# Patient Record
Sex: Male | Born: 1945 | Race: White | Hispanic: No | Marital: Married | Smoking: Former smoker
Health system: Southern US, Community
[De-identification: ages and names within clinical notes are randomized; demographics above are authoritative.]

## PROBLEM LIST (undated history)

## (undated) DIAGNOSIS — I252 Old myocardial infarction: Secondary | ICD-10-CM

## (undated) DIAGNOSIS — M1990 Unspecified osteoarthritis, unspecified site: Secondary | ICD-10-CM

## (undated) DIAGNOSIS — I2510 Atherosclerotic heart disease of native coronary artery without angina pectoris: Secondary | ICD-10-CM

## (undated) DIAGNOSIS — Z87442 Personal history of urinary calculi: Secondary | ICD-10-CM

## (undated) DIAGNOSIS — E782 Mixed hyperlipidemia: Secondary | ICD-10-CM

## (undated) DIAGNOSIS — I119 Hypertensive heart disease without heart failure: Secondary | ICD-10-CM

## (undated) HISTORY — PX: OTHER SURGICAL HISTORY: SHX169

## (undated) HISTORY — DX: Personal history of urinary calculi: Z87.442

## (undated) HISTORY — DX: Unspecified osteoarthritis, unspecified site: M19.90

## (undated) HISTORY — PX: CATARACT EXTRACTION, BILATERAL: SHX1313

## (undated) HISTORY — DX: Mixed hyperlipidemia: E78.2

## (undated) HISTORY — DX: Hypertensive heart disease without heart failure: I11.9

## (undated) HISTORY — PX: PTCA: SHX146

## (undated) HISTORY — DX: Old myocardial infarction: I25.2

## (undated) HISTORY — PX: HERNIA REPAIR: SHX51

## (undated) HISTORY — DX: Atherosclerotic heart disease of native coronary artery without angina pectoris: I25.10

---

## 2007-06-12 ENCOUNTER — Inpatient Hospital Stay (HOSPITAL_COMMUNITY): Admission: AD | Admit: 2007-06-12 | Discharge: 2007-06-14 | Payer: Self-pay | Admitting: Neurosurgery

## 2010-08-22 ENCOUNTER — Inpatient Hospital Stay (HOSPITAL_COMMUNITY)
Admission: EM | Admit: 2010-08-22 | Discharge: 2010-08-25 | Payer: Self-pay | Source: Home / Self Care | Attending: Orthopedic Surgery | Admitting: Orthopedic Surgery

## 2010-09-26 NOTE — Discharge Summary (Signed)
  Bryan Zimmerman, SCHNACKENBERG NO.:  [id number]  MEDICAL RECORD NO.:  [id number]          PATIENT TYPE:  INP  LOCATION:  5024                         FACILITY:  [facility]  PHYSICIAN:  Dionne Ano. 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  He was taken to the operative arena where he underwent an extensive microsurgical reconstruction.  We repaired bone, tendon, artery, nerve, and veins.  The patient was seen and treated on his date of admission August 22, 2010.  Subsequent to this, the patient had postoperative course as detailed.  I should note his full H&P was completed August 22, 2010 and is dictated.  His postoperative course was significant for being placed on dextran as well as aspirin.  He matriculated to his postoperative course nicely and on August 25, 2010 was noted to have a clear chest, nontender abdomen, tolerating a regular diet, voiding well, had excellent refit to the tip of his thumb.  No signs or symptoms of DVT, urinary tract infection, or other problems and was stable for discharge with stable cardiovascular features.  He was discharged home on August 25, 2010.  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  DISCHARGE MEDICINES: 1. Percocet 5 mg 1-2 q.4-6 h. p.r.n. pain p.o., dispensed #40. 2. Aspirin 325 mg enteric-coated tablet b.i.d. x2 weeks, then once     daily x6 weeks. 3. Keflex 500 mg t.i.d. x7 days. 4. Peri-Colace b.i.d.  He will return to the office to see [place] in 7-10 days and continue full neurovascular precautions as discussed.  It has been a pleasure to see him and treat him.  We  wish him the best for the future.     Dionne Ano. Amanda Pea, M.D.     Nash Mantis  D:  09/20/2010  T:  09/21/2010  Job:  [id number]  Electronically Signed by Dominica Severin M.D. on 09/26/2010 06:51:49 PM

## 2010-10-30 LAB — DIFFERENTIAL
Basophils Absolute: 0 10*3/uL (ref 0.0–0.1)
Basophils Absolute: 0 10*3/uL (ref 0.0–0.1)
Basophils Relative: 0 % (ref 0–1)
Basophils Relative: 1 % (ref 0–1)
Eosinophils Absolute: 0.2 10*3/uL (ref 0.0–0.7)
Eosinophils Absolute: 0.3 10*3/uL (ref 0.0–0.7)
Eosinophils Relative: 3 % (ref 0–5)
Eosinophils Relative: 4 % (ref 0–5)
Lymphocytes Relative: 12 % (ref 12–46)
Lymphocytes Relative: 14 % (ref 12–46)
Lymphs Abs: 0.9 10*3/uL (ref 0.7–4.0)
Lymphs Abs: 1.1 10*3/uL (ref 0.7–4.0)
Monocytes Absolute: 0.6 10*3/uL (ref 0.1–1.0)
Monocytes Absolute: 0.7 10*3/uL (ref 0.1–1.0)
Monocytes Relative: 9 % (ref 3–12)
Monocytes Relative: 9 % (ref 3–12)
Neutro Abs: 5.3 10*3/uL (ref 1.7–7.7)
Neutro Abs: 5.6 10*3/uL (ref 1.7–7.7)
Neutrophils Relative %: 73 % (ref 43–77)
Neutrophils Relative %: 74 % (ref 43–77)

## 2010-10-30 LAB — CBC
HCT: 39.1 % (ref 39.0–52.0)
HCT: 42.3 % (ref 39.0–52.0)
Hemoglobin: 13.4 g/dL (ref 13.0–17.0)
Hemoglobin: 14.7 g/dL (ref 13.0–17.0)
MCH: 29.8 pg (ref 26.0–34.0)
MCH: 30 pg (ref 26.0–34.0)
MCHC: 34.3 g/dL (ref 30.0–36.0)
MCHC: 34.8 g/dL (ref 30.0–36.0)
MCV: 85.8 fL (ref 78.0–100.0)
MCV: 87.5 fL (ref 78.0–100.0)
Platelets: 143 10*3/uL — ABNORMAL LOW (ref 150–400)
Platelets: 151 10*3/uL (ref 150–400)
RBC: 4.47 MIL/uL (ref 4.22–5.81)
RBC: 4.93 MIL/uL (ref 4.22–5.81)
RDW: 12.7 % (ref 11.5–15.5)
RDW: 12.7 % (ref 11.5–15.5)
WBC: 7.2 10*3/uL (ref 4.0–10.5)
WBC: 7.6 10*3/uL (ref 4.0–10.5)

## 2010-10-30 LAB — COMPREHENSIVE METABOLIC PANEL
ALT: 27 U/L (ref 0–53)
ALT: 28 U/L (ref 0–53)
AST: 22 U/L (ref 0–37)
AST: 26 U/L (ref 0–37)
Albumin: 3.3 g/dL — ABNORMAL LOW (ref 3.5–5.2)
Albumin: 3.7 g/dL (ref 3.5–5.2)
Alkaline Phosphatase: 60 U/L (ref 39–117)
Alkaline Phosphatase: 70 U/L (ref 39–117)
BUN: 13 mg/dL (ref 6–23)
BUN: 16 mg/dL (ref 6–23)
CO2: 25 mEq/L (ref 19–32)
CO2: 27 mEq/L (ref 19–32)
Calcium: 8.4 mg/dL (ref 8.4–10.5)
Calcium: 8.9 mg/dL (ref 8.4–10.5)
Chloride: 105 mEq/L (ref 96–112)
Chloride: 105 mEq/L (ref 96–112)
Creatinine, Ser: 0.89 mg/dL (ref 0.4–1.5)
Creatinine, Ser: 0.95 mg/dL (ref 0.4–1.5)
GFR calc Af Amer: >60 mL/min (ref >60)
GFR calc Af Amer: >60 mL/min (ref >60)
GFR calc non Af Amer: >60 mL/min (ref >60)
GFR calc non Af Amer: >60 mL/min (ref >60)
Glucose, Bld: 116 mg/dL — ABNORMAL HIGH (ref 70–99)
Glucose, Bld: 120 mg/dL — ABNORMAL HIGH (ref 70–99)
Potassium: 4 mEq/L (ref 3.5–5.1)
Potassium: 4.4 mEq/L (ref 3.5–5.1)
Sodium: 138 mEq/L (ref 135–145)
Sodium: 138 mEq/L (ref 135–145)
Total Bilirubin: 0.3 mg/dL (ref 0.3–1.2)
Total Bilirubin: 0.5 mg/dL (ref 0.3–1.2)
Total Protein: 5.9 g/dL — ABNORMAL LOW (ref 6.0–8.3)
Total Protein: 6.3 g/dL (ref 6.0–8.3)

## 2010-10-30 LAB — APTT: aPTT: 29 seconds (ref 24–37)

## 2010-10-30 LAB — PROTIME-INR
INR: 0.96 (ref 0.00–1.49)
Prothrombin Time: 13 seconds (ref 11.6–15.2)

## 2010-11-29 NOTE — H&P (Signed)
NAMEPRIYANSH, PRY NO.:  [id number]  MEDICAL RECORD NO.:  [id number]          PATIENT TYPE:  INP  LOCATION:  5024                         FACILITY:  [facility]  PHYSICIAN:  Dionne Ano. Taron Mondor, M.D.DATE OF BIRTH:  May 04, 1946  DATE OF ADMISSION:  08/22/2010 DATE OF DISCHARGE:                             HISTORY & PHYSICAL   CHIEF COMPLAINT:  "I cut my thumb."  HISTORY OF PRESENT ILLNESS:  Mr. Brow is a very pleasant gentleman who is 65 years of age and is left-hand dominant who presents to the [facility] Emergency Room for evaluation of his right thumb after a skill saw injury that he sustained earlier today approximately 1:15.  He was making a gate for his dog when he sustained a skill saw injury to the thumb sustaining a near complete amputation to the thumb.  He proceeded immediately to the [facility] Emergency Room for evaluation. He describes numbness and tingling along the radial digital nerve aspect of the thumb, but does have some sensation per his description along the radial digital nerve aspect.  His tetanus was updated in the emergency room setting.  He has been n.p.o. since approximately 12:30 today at which point in time he states he ate "a sandwich."  He was given Dilaudid in the emergency room setting, thus his pain is currently under control.  He denies any injury to the remaining digits or hand or wrist region.  PAST MEDICAL HISTORY:  Significant for "kidney stones and degenerative disk disease as well as hard-of-hearing."  CURRENT MEDICATIONS:  He takes Advil intermittently, Osteo Bi-Flex, and multivitamins.  ALLERGIES:  No known drug allergies.  SURGICAL HISTORY:  He has had a prior questionable L4-L5 procedure performed by Dr. Wynetta Emery.  In addition he has had "a hernia repair" unspecified as well as a nephrolithiasis removal.  SOCIAL HISTORY:  He is married.  He denies tobacco, alcohol use or illicit drug use.  FAMILY MEDICAL  HISTORY:  Noncontributory.  PHYSICAL EXAMINATION:  GENERAL:  He is a very pleasant gentleman in no acute distress, appearing his stated age, weight and height.  He is alert and oriented x3, well groomed. VITAL SIGNS:  Blood pressure is 109/83 at the time of admit, respirations 18, temperature is 98.2, O2 sats 98% on room air, heart rate 74. HEENT:  He is hard-of-hearing, otherwise head is atraumatic, normocephalic. NECK:  Supple. CHEST:  Clear to auscultation bilaterally. HEART:  S1, S2. ABDOMEN:  Soft, nontender.  Bowel sounds are positive.  No significant pain or guarding is present. EXTREMITIES:  Evaluation of lower extremity showed his range of motion is intact.  Strength is intact.  He is nontender about the lower extremities.  Evaluation of the upper extremities particularly right upper extremity shows that he has a circumstantial laceration encompassing approximately 60% of the thumb beginning approximately midline volarly at the level of MP joint pain radiating dorsal laterally to the midportion of the dorsal MP joint.  There is minimal [employer] functioning.  He is known to have sluggish refill present about the digit.  He has marked diminished sensation along the radial digital nerve aspect.  He does have  sensation along the ulnar digital nerve aspect per his description and with pinpoint testing.  Remaining digits show that his range of motion intact.  Sensation refill is intact.  Radiographs, two views of the hand are reviewed and shows that he has an open fracture process sustaining just distal to the metaphysis at the MCP joint.  This is apparently extraarticular.  ASSESSMENT: 1. Status post skill saw injury to the right thumb with near complete     amputation and open fracture and obvious nerve artery intended     damage present. 2. History of low back surgeries, history of hernia repair, history of     hard-of-hearing. History of "kidney stones" removed.  PLAN:  We  had a lengthy discussion with Mr. Loftus in regards to his upper extremity predicament and the serious nature of the injury in regards to the viability of his thumb.  We would recommend proceeding to the OR emergently for I and D, ORIF and attempted repair of nerve artery and tendinous structures as necessary.  He understands that risk of surgery can include infection, bleeding, anesthesia, damage to normal structures, loss of digit and potential need for future reconstructive procedures to the thumb.  With this in mind, they decided to proceed. Thus we will obtain preoperative labs as they are currently pending and proceed to the operative room.  All questions were encouraged and answered.     Karie Chimera, P.A.-C.   ______________________________ Dionne Ano. Amanda Pea, M.D.    BB/MEDQ  D:  08/22/2010  T:  08/23/2010  Job:  [id number]  Electronically Signed by Karie Chimera P.A.-C. on 11/22/2010 03:50:33 PM Electronically Signed by Dominica Severin M.D. on 11/29/2010 12:38:39 PM

## 2011-01-02 NOTE — Op Note (Signed)
NAMENOX, TALENT NO.:  [id number]   MEDICAL RECORD NO.:  [id number]          PATIENT TYPE:  INP   LOCATION:  5501                         FACILITY:  [facility]   PHYSICIAN:  Donalee Citrin, M.D.        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   PROCEDURE:  Combined intra and extraforaminal approach to lumbar  microdiskectomy with a lumbar laminectomy and extraforaminal approach  and right lumbar microdiskectomy.   SURGEON:  Donalee Citrin, M.D.   ASSISTANT:  Reinaldo Meeker, M.D.   ANESTHESIA:  General endotracheal.   HISTORY OF PRESENT ILLNESS:  The patient is a pleasant 65 year old  gentleman who presented to the office in acute radicular pain with  inability to ambulate secondary to pain control.  The patient had pain  consistent with L4 nerve root pattern.  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  The patient had already failed  trials of steroids and aggressive narcotic use and was admitted from the  office and taken to the operating room within 24 hours for evacuation,  diskectomy, and decompression.  The risks and benefits of the operation  __________ The patient was brought to the OR and induced under general  anesthesia.  The patient was placed prone on the Wilson frame.  The back  was prepped __________ localize the appropriate level so a midline  incision will be made and Bovie electrocautery was used __________  subperiosteal dissection carried out at the lamina of L3-4 and the top  of L5.  The TP at L4 was exposed.  Intraoperative x-ray confirmed this  to be the L4 TP at the  level of the L4 pedicle, and then first doing the  extraforaminal approach to the lateral aspect of facet complex at L4-5  and lateral aspect of the pars at L4 was drilled down and then using a 4  Penfield and a 1 Penfield, the intertransverse ligament was dissected  off of the undersurface of the lateral margin of the facet complex and  pars, and this was removed in piecemeal fashion with a __________  Kerrison punch.  At this point, the intertransverse ligament was  identified.  It was peeled away and removed in piecemeal fashion.  The  L4 nerve root was identified, at least it was presumed to be the L4  nerve root was identified, and it was noted to be markedly flattened  against a very large disk herniation still contained with the ligament  at this level.  It was very difficult to determine the beginnings of the  root and the beginning of the disk rupture, so at this time, attention  was taken to exposure from within the canal to further identify the  anatomy  so the inferior aspect of L4, medial facet complex, and superior  aspect of L5 was removed.  The ligamentum flavum was removed in  piecemeal fashion which was noted to be markedly hypertrophic and  displacing the thecal sac medially.  This was all teased away with a 4  Penfield and removed in piecemeal fashion with a __________  Kerrison  punch.  At this point, the operative microscope was draped and brought  onto the field.  Under microscopic illumination, the remainder of the  lateral ligament was removed to identify the lateral margin of the  thecal sac and disk space.  The proximal L5 nerve root was identified  and unroofed, and this enabled [place] access to the level of the disk space.  Then working back in the extraforaminal space under microscopic  illumination, the remainder of the intertransverse ligament was removed  and further medial exposure of the proximal takeoff of the L4 nerve root  was obtained from the  extraforaminal space.  This allowed further  identification of the L4 nerve root.  A 4 Penfield was used to dissect  the L4 nerve root over the top of the aspect of the ruptured disk.  At  this point, a small puncture hole was made in the ligament containing  the disk, and several large fragments of disk were immediately  expressed.  After this was confirmed to be appropriate identification of  the ruptured disk, the annulotomy was extended, and using a blunt nerve  hook, several more very large fragments of disk were removed from  medially under the undersurface of the 4 root.  Then the L4 nerve root  was still markedly tethered and displaced superiorly as well as some  displacement of the proximal aspect of the root in the extraforaminal  space.  At this point, it was decided to enter the disk space from  within the canal to further identify the foraminal component of the disk  rupture.  The __________  was used to reflect the L5 nerve root  medially.  Annulotomy was made, and disk space was cleaned out.  Several  large fragments were removed from the superior aspect of the disk space  from within the canal that was not appreciated on the MRI scan.  This  also tracked out into the extraforaminal space, so working from both  inside and outside, a large component of disk material was removed from  underneath the facet complex with a combination of a downgoing Epstein  curette and upbiting pituitaries.  Going back and forth in this fashion  allowed further identification and decompressing of the 4 root. Some  additional disk was removed from both proximally and distally at the 4  root at the extraforaminal space, and at the end of the diskectomy,  there was no further stenosis on the 4 root or the 5 root.  It was  explored with a hockey stick and coronary dilator.  The 4 root was flush  against the pedicle and easily mobilizable.  The wound was then  copiously irrigated, and meticulous  hemostasis was maintained.  Gelfoam  was overlaid on top of the dura in both compartments, and the muscle and  fascia were reapproximated in layers with interrupted 3-0 Vicryl and  subcuticular Dermabond.  Benzoin and Steri-Strips were then applied, and  the patient went to the recovery room in stable condition.  At the end  of the case, all counts were correct.  ______________________________  Donalee Citrin, M.D.     GC/MEDQ  D:  06/13/2007  T:  06/15/2007  Job:  [id number]

## 2011-01-05 NOTE — H&P (Signed)
Bryan, Zimmerman NO.:  [id number]   MEDICAL RECORD NO.:  [id number]          PATIENT TYPE:  INP   LOCATION:  5501                         FACILITY:  [facility]   PHYSICIAN:  Donalee Citrin, M.D.        DATE OF BIRTH:  1946-03-14   DATE OF ADMISSION:  06/12/2007  DATE OF DISCHARGE:  06/14/2007                              HISTORY & PHYSICAL   ADMITTING DIAGNOSIS:  Lumbar four radiculopathy from large  extraforaminal disc herniation, lumbar four-five, on the right,  compressing the right lumbar four nerve root.   HISTORY OF PRESENT ILLNESS:  The patient is a 65 year old gentleman who  presented to the office today in excruciating right buttock and leg  pain, nonambulatory, unable to tolerate it.  This has been going on for  several days, and progressively worse over the last week.  He denied any  left leg symptoms or bowel or bladder complaints.   PAST MEDICAL HISTORY:  Noncontributory.   PAST SURGICAL HISTORY:  Noncontributory.   PHYSICAL EXAMINATION:  GENERAL:  The patient was awake, alert, oriented  x4.  Cranial nerves were intact.  Pupils were equal __________ .  EXTREMITIES:  Strength in the upper extremities is 5/5, lower extremity  strength was also 5/5.  He did have some decreased sensation in the L4  distribution of the right leg and a positive straight leg raise.  CARDIOPULMONARY:  Exam was within normal limits.   MRI scan showed a very large extraforaminal disc herniation on the right  L4, and the patient was in intractable pain to all our narcotic and  steroid pain control.  The patient will be admitted for surgery tomorrow  to decompress the right L4 nerve root.           ______________________________  Donalee Citrin, M.D.     GC/MEDQ  D:  07/31/2007  T:  07/31/2007  Job:  [id number]

## 2011-05-30 LAB — BASIC METABOLIC PANEL
BUN: 22
CO2: 28
Calcium: 9.6
Chloride: 101
Creatinine, Ser: 0.81
GFR calc Af Amer: >60
GFR calc non Af Amer: >60
Glucose, Bld: 89
Potassium: 4.1
Sodium: 139

## 2011-05-30 LAB — DIFFERENTIAL
Basophils Absolute: 0.1
Basophils Relative: 1
Eosinophils Absolute: 0.1
Eosinophils Relative: 1
Lymphocytes Relative: 17
Lymphs Abs: 1.3
Monocytes Absolute: 0.4
Monocytes Relative: 5
Neutro Abs: 5.9
Neutrophils Relative %: 76

## 2011-05-30 LAB — CBC
HCT: 46.9
Hemoglobin: 16
MCHC: 34
MCV: 86.7
Platelets: UNDETERMINED
RBC: 5.41
RDW: 12.5
WBC: 7.8

## 2011-11-05 DIAGNOSIS — E785 Hyperlipidemia, unspecified: Secondary | ICD-10-CM | POA: Diagnosis not present

## 2011-11-05 DIAGNOSIS — I2510 Atherosclerotic heart disease of native coronary artery without angina pectoris: Secondary | ICD-10-CM | POA: Diagnosis not present

## 2011-11-05 DIAGNOSIS — I10 Essential (primary) hypertension: Secondary | ICD-10-CM | POA: Diagnosis not present

## 2011-12-19 DIAGNOSIS — S61409A Unspecified open wound of unspecified hand, initial encounter: Secondary | ICD-10-CM | POA: Diagnosis not present

## 2011-12-19 DIAGNOSIS — S6990XA Unspecified injury of unspecified wrist, hand and finger(s), initial encounter: Secondary | ICD-10-CM | POA: Diagnosis not present

## 2011-12-19 DIAGNOSIS — T1490XA Injury, unspecified, initial encounter: Secondary | ICD-10-CM | POA: Diagnosis not present

## 2012-01-11 DIAGNOSIS — E785 Hyperlipidemia, unspecified: Secondary | ICD-10-CM | POA: Diagnosis not present

## 2012-01-11 DIAGNOSIS — I2510 Atherosclerotic heart disease of native coronary artery without angina pectoris: Secondary | ICD-10-CM | POA: Diagnosis not present

## 2012-01-11 DIAGNOSIS — Z79899 Other long term (current) drug therapy: Secondary | ICD-10-CM | POA: Diagnosis not present

## 2012-02-07 DIAGNOSIS — E785 Hyperlipidemia, unspecified: Secondary | ICD-10-CM | POA: Diagnosis not present

## 2012-02-07 DIAGNOSIS — I10 Essential (primary) hypertension: Secondary | ICD-10-CM | POA: Diagnosis not present

## 2012-02-07 DIAGNOSIS — I2510 Atherosclerotic heart disease of native coronary artery without angina pectoris: Secondary | ICD-10-CM | POA: Diagnosis not present

## 2012-02-07 DIAGNOSIS — Z6828 Body mass index (BMI) 28.0-28.9, adult: Secondary | ICD-10-CM | POA: Diagnosis not present

## 2012-02-17 IMAGING — CR DG CHEST 2V
1 series · 1 of 1 positions shown · non-contrast
Comparison: None.

CLINICAL DATA: Chest pain.

CHEST - 2 VIEW

[view not recorded]
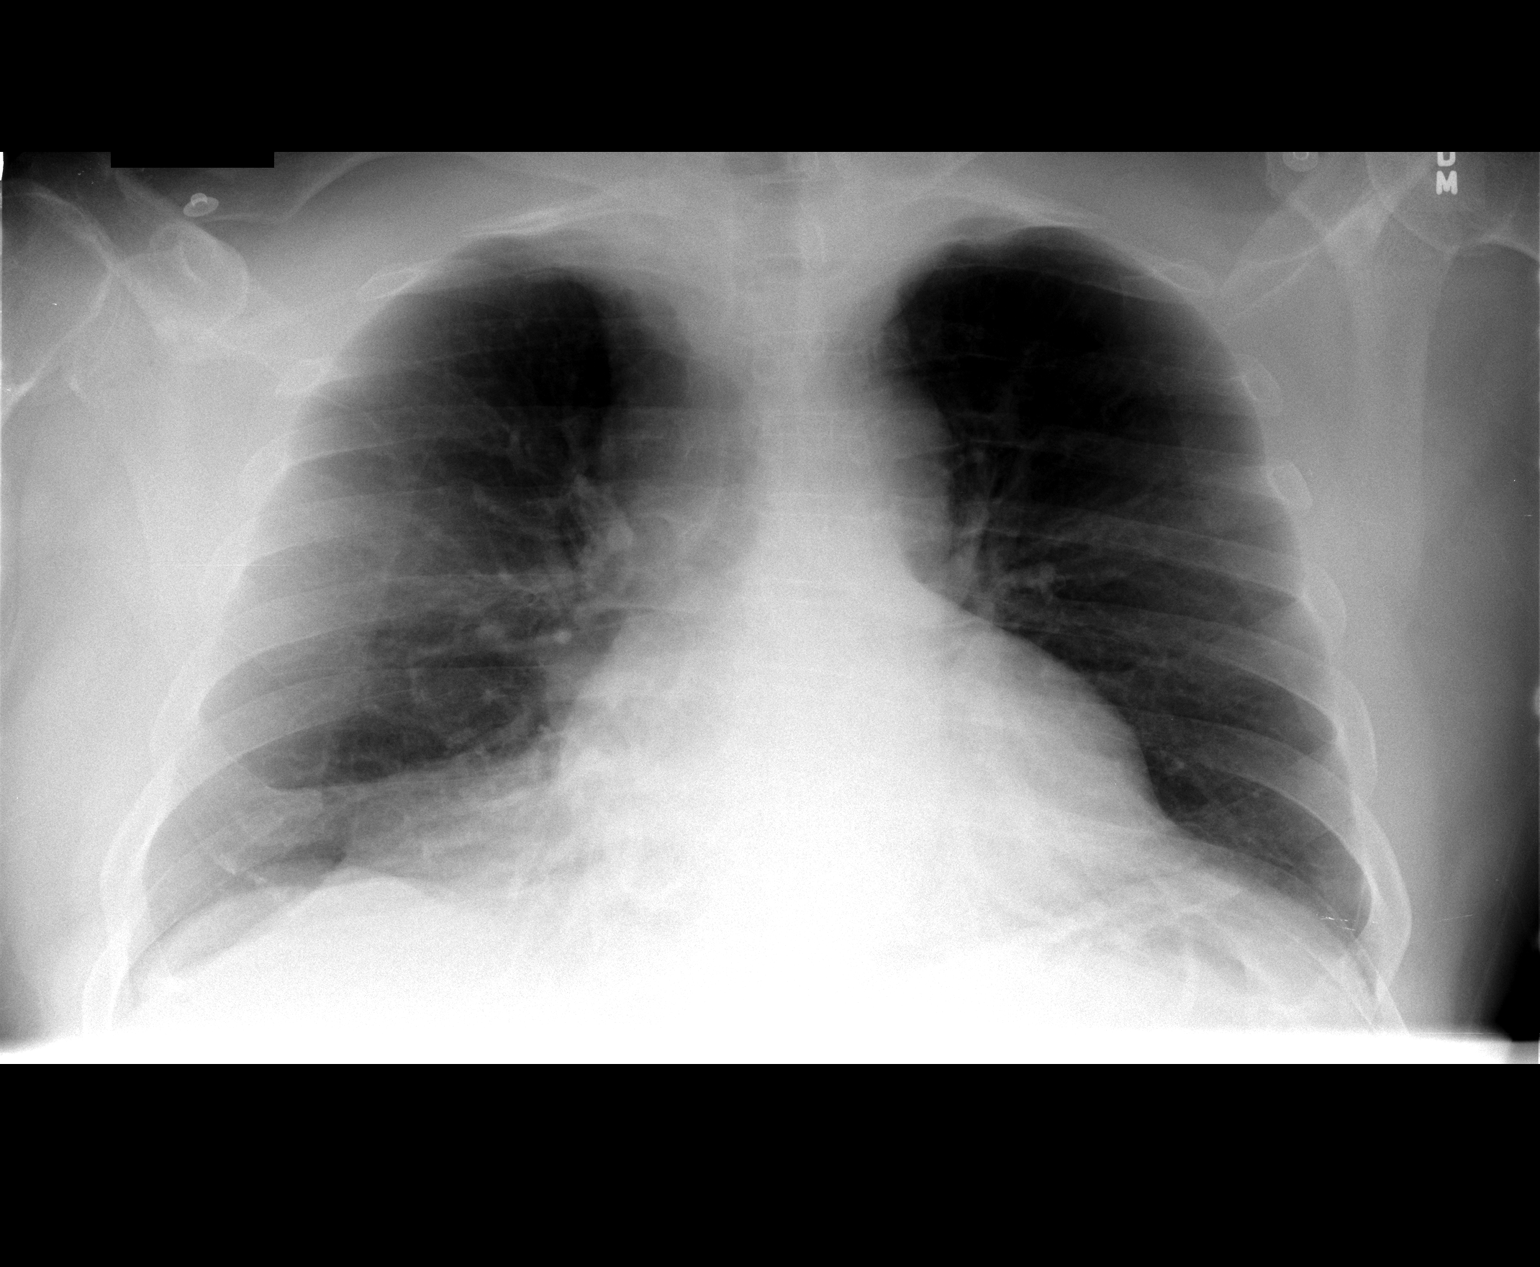

[1 of 1 positions shown; findings below may reference images not displayed]

FINDINGS: Lateral view degraded by patient arm position.

Frontal view degraded by AP portable technique.  Apical lordotic
patient positioning. Midline trachea.  Normal heart size for level
of inspiration.  No pleural effusion or pneumothorax.  Clear lungs.
IMPRESSION: 1. No acute cardiopulmonary disease.
2.  Mildly degraded by apical lordotic AP frontal view.

## 2012-05-13 DIAGNOSIS — I2510 Atherosclerotic heart disease of native coronary artery without angina pectoris: Secondary | ICD-10-CM | POA: Diagnosis not present

## 2012-05-13 DIAGNOSIS — Z125 Encounter for screening for malignant neoplasm of prostate: Secondary | ICD-10-CM | POA: Diagnosis not present

## 2012-05-13 DIAGNOSIS — E785 Hyperlipidemia, unspecified: Secondary | ICD-10-CM | POA: Diagnosis not present

## 2012-06-19 DIAGNOSIS — Z1211 Encounter for screening for malignant neoplasm of colon: Secondary | ICD-10-CM | POA: Diagnosis not present

## 2012-08-26 DIAGNOSIS — E785 Hyperlipidemia, unspecified: Secondary | ICD-10-CM | POA: Diagnosis not present

## 2012-08-26 DIAGNOSIS — I2510 Atherosclerotic heart disease of native coronary artery without angina pectoris: Secondary | ICD-10-CM | POA: Diagnosis not present

## 2012-08-26 DIAGNOSIS — I10 Essential (primary) hypertension: Secondary | ICD-10-CM | POA: Diagnosis not present

## 2012-08-26 DIAGNOSIS — Z23 Encounter for immunization: Secondary | ICD-10-CM | POA: Diagnosis not present

## 2012-08-26 DIAGNOSIS — Z6828 Body mass index (BMI) 28.0-28.9, adult: Secondary | ICD-10-CM | POA: Diagnosis not present

## 2012-11-10 DIAGNOSIS — Z1211 Encounter for screening for malignant neoplasm of colon: Secondary | ICD-10-CM | POA: Diagnosis not present

## 2012-11-11 DIAGNOSIS — Z1211 Encounter for screening for malignant neoplasm of colon: Secondary | ICD-10-CM | POA: Diagnosis not present

## 2012-11-27 DIAGNOSIS — I10 Essential (primary) hypertension: Secondary | ICD-10-CM | POA: Diagnosis not present

## 2012-11-27 DIAGNOSIS — E785 Hyperlipidemia, unspecified: Secondary | ICD-10-CM | POA: Diagnosis not present

## 2012-11-27 DIAGNOSIS — I2510 Atherosclerotic heart disease of native coronary artery without angina pectoris: Secondary | ICD-10-CM | POA: Diagnosis not present

## 2013-03-09 DIAGNOSIS — M25519 Pain in unspecified shoulder: Secondary | ICD-10-CM | POA: Diagnosis not present

## 2013-03-09 DIAGNOSIS — I2510 Atherosclerotic heart disease of native coronary artery without angina pectoris: Secondary | ICD-10-CM | POA: Diagnosis not present

## 2013-03-09 DIAGNOSIS — Z6828 Body mass index (BMI) 28.0-28.9, adult: Secondary | ICD-10-CM | POA: Diagnosis not present

## 2013-03-09 DIAGNOSIS — E785 Hyperlipidemia, unspecified: Secondary | ICD-10-CM | POA: Diagnosis not present

## 2013-06-09 DIAGNOSIS — E785 Hyperlipidemia, unspecified: Secondary | ICD-10-CM | POA: Diagnosis not present

## 2013-06-09 DIAGNOSIS — I2510 Atherosclerotic heart disease of native coronary artery without angina pectoris: Secondary | ICD-10-CM | POA: Diagnosis not present

## 2013-06-09 DIAGNOSIS — I10 Essential (primary) hypertension: Secondary | ICD-10-CM | POA: Diagnosis not present

## 2013-06-09 DIAGNOSIS — Z23 Encounter for immunization: Secondary | ICD-10-CM | POA: Diagnosis not present

## 2013-06-09 DIAGNOSIS — Z125 Encounter for screening for malignant neoplasm of prostate: Secondary | ICD-10-CM | POA: Diagnosis not present

## 2013-06-09 DIAGNOSIS — R29898 Other symptoms and signs involving the musculoskeletal system: Secondary | ICD-10-CM | POA: Diagnosis not present

## 2013-07-07 DIAGNOSIS — Z8619 Personal history of other infectious and parasitic diseases: Secondary | ICD-10-CM | POA: Diagnosis not present

## 2013-09-11 DIAGNOSIS — I10 Essential (primary) hypertension: Secondary | ICD-10-CM | POA: Diagnosis not present

## 2013-09-11 DIAGNOSIS — I2510 Atherosclerotic heart disease of native coronary artery without angina pectoris: Secondary | ICD-10-CM | POA: Diagnosis not present

## 2013-09-11 DIAGNOSIS — E785 Hyperlipidemia, unspecified: Secondary | ICD-10-CM | POA: Diagnosis not present

## 2013-12-23 DIAGNOSIS — M25569 Pain in unspecified knee: Secondary | ICD-10-CM | POA: Diagnosis not present

## 2013-12-23 DIAGNOSIS — M898X9 Other specified disorders of bone, unspecified site: Secondary | ICD-10-CM | POA: Diagnosis not present

## 2013-12-23 DIAGNOSIS — E7800 Pure hypercholesterolemia, unspecified: Secondary | ICD-10-CM | POA: Diagnosis not present

## 2013-12-23 DIAGNOSIS — IMO0002 Reserved for concepts with insufficient information to code with codable children: Secondary | ICD-10-CM | POA: Diagnosis not present

## 2013-12-23 DIAGNOSIS — I10 Essential (primary) hypertension: Secondary | ICD-10-CM | POA: Diagnosis not present

## 2013-12-23 DIAGNOSIS — M1710 Unilateral primary osteoarthritis, unspecified knee: Secondary | ICD-10-CM | POA: Diagnosis not present

## 2013-12-23 DIAGNOSIS — M25869 Other specified joint disorders, unspecified knee: Secondary | ICD-10-CM | POA: Diagnosis not present

## 2013-12-23 DIAGNOSIS — I2510 Atherosclerotic heart disease of native coronary artery without angina pectoris: Secondary | ICD-10-CM | POA: Diagnosis not present

## 2013-12-24 DIAGNOSIS — E7800 Pure hypercholesterolemia, unspecified: Secondary | ICD-10-CM | POA: Diagnosis not present

## 2013-12-24 DIAGNOSIS — I10 Essential (primary) hypertension: Secondary | ICD-10-CM | POA: Diagnosis not present

## 2013-12-29 DIAGNOSIS — D696 Thrombocytopenia, unspecified: Secondary | ICD-10-CM | POA: Diagnosis not present

## 2014-01-20 DIAGNOSIS — E7800 Pure hypercholesterolemia, unspecified: Secondary | ICD-10-CM | POA: Diagnosis not present

## 2014-01-20 DIAGNOSIS — IMO0001 Reserved for inherently not codable concepts without codable children: Secondary | ICD-10-CM | POA: Diagnosis not present

## 2014-01-20 DIAGNOSIS — M1710 Unilateral primary osteoarthritis, unspecified knee: Secondary | ICD-10-CM | POA: Diagnosis not present

## 2014-02-16 DIAGNOSIS — Z79899 Other long term (current) drug therapy: Secondary | ICD-10-CM | POA: Diagnosis not present

## 2014-02-16 DIAGNOSIS — I2510 Atherosclerotic heart disease of native coronary artery without angina pectoris: Secondary | ICD-10-CM | POA: Diagnosis not present

## 2014-02-16 DIAGNOSIS — E785 Hyperlipidemia, unspecified: Secondary | ICD-10-CM | POA: Diagnosis not present

## 2014-02-23 DIAGNOSIS — I2510 Atherosclerotic heart disease of native coronary artery without angina pectoris: Secondary | ICD-10-CM | POA: Diagnosis not present

## 2014-04-20 DIAGNOSIS — Z23 Encounter for immunization: Secondary | ICD-10-CM | POA: Diagnosis not present

## 2014-05-03 DIAGNOSIS — M1710 Unilateral primary osteoarthritis, unspecified knee: Secondary | ICD-10-CM | POA: Diagnosis not present

## 2014-05-03 DIAGNOSIS — I10 Essential (primary) hypertension: Secondary | ICD-10-CM | POA: Diagnosis not present

## 2014-05-03 DIAGNOSIS — E7800 Pure hypercholesterolemia, unspecified: Secondary | ICD-10-CM | POA: Diagnosis not present

## 2014-05-03 DIAGNOSIS — I2510 Atherosclerotic heart disease of native coronary artery without angina pectoris: Secondary | ICD-10-CM | POA: Diagnosis not present

## 2014-05-18 DIAGNOSIS — I2510 Atherosclerotic heart disease of native coronary artery without angina pectoris: Secondary | ICD-10-CM | POA: Diagnosis not present

## 2014-05-18 DIAGNOSIS — Z79899 Other long term (current) drug therapy: Secondary | ICD-10-CM | POA: Diagnosis not present

## 2014-05-18 DIAGNOSIS — E785 Hyperlipidemia, unspecified: Secondary | ICD-10-CM | POA: Diagnosis not present

## 2014-06-24 DIAGNOSIS — Z125 Encounter for screening for malignant neoplasm of prostate: Secondary | ICD-10-CM | POA: Diagnosis not present

## 2014-06-24 DIAGNOSIS — Z1211 Encounter for screening for malignant neoplasm of colon: Secondary | ICD-10-CM | POA: Diagnosis not present

## 2014-06-24 DIAGNOSIS — Z0000 Encounter for general adult medical examination without abnormal findings: Secondary | ICD-10-CM | POA: Diagnosis not present

## 2014-06-25 DIAGNOSIS — Z23 Encounter for immunization: Secondary | ICD-10-CM | POA: Diagnosis not present

## 2014-07-05 DIAGNOSIS — Z1211 Encounter for screening for malignant neoplasm of colon: Secondary | ICD-10-CM | POA: Diagnosis not present

## 2014-08-16 DIAGNOSIS — E780 Pure hypercholesterolemia: Secondary | ICD-10-CM | POA: Diagnosis not present

## 2014-08-16 DIAGNOSIS — I10 Essential (primary) hypertension: Secondary | ICD-10-CM | POA: Diagnosis not present

## 2014-08-19 DIAGNOSIS — M170 Bilateral primary osteoarthritis of knee: Secondary | ICD-10-CM | POA: Diagnosis not present

## 2014-08-19 DIAGNOSIS — I25111 Atherosclerotic heart disease of native coronary artery with angina pectoris with documented spasm: Secondary | ICD-10-CM | POA: Diagnosis not present

## 2014-08-19 DIAGNOSIS — I10 Essential (primary) hypertension: Secondary | ICD-10-CM | POA: Diagnosis not present

## 2014-08-19 DIAGNOSIS — E780 Pure hypercholesterolemia: Secondary | ICD-10-CM | POA: Diagnosis not present

## 2014-09-07 DIAGNOSIS — H18413 Arcus senilis, bilateral: Secondary | ICD-10-CM | POA: Diagnosis not present

## 2014-09-07 DIAGNOSIS — H25813 Combined forms of age-related cataract, bilateral: Secondary | ICD-10-CM | POA: Diagnosis not present

## 2014-11-30 DIAGNOSIS — I10 Essential (primary) hypertension: Secondary | ICD-10-CM | POA: Diagnosis not present

## 2014-11-30 DIAGNOSIS — E780 Pure hypercholesterolemia: Secondary | ICD-10-CM | POA: Diagnosis not present

## 2014-12-03 DIAGNOSIS — I25111 Atherosclerotic heart disease of native coronary artery with angina pectoris with documented spasm: Secondary | ICD-10-CM | POA: Diagnosis not present

## 2014-12-03 DIAGNOSIS — I10 Essential (primary) hypertension: Secondary | ICD-10-CM | POA: Diagnosis not present

## 2014-12-03 DIAGNOSIS — E780 Pure hypercholesterolemia: Secondary | ICD-10-CM | POA: Diagnosis not present

## 2014-12-27 DIAGNOSIS — I10 Essential (primary) hypertension: Secondary | ICD-10-CM | POA: Diagnosis not present

## 2015-03-01 DIAGNOSIS — E780 Pure hypercholesterolemia: Secondary | ICD-10-CM | POA: Diagnosis not present

## 2015-03-01 DIAGNOSIS — I10 Essential (primary) hypertension: Secondary | ICD-10-CM | POA: Diagnosis not present

## 2015-03-04 DIAGNOSIS — I10 Essential (primary) hypertension: Secondary | ICD-10-CM | POA: Diagnosis not present

## 2015-03-04 DIAGNOSIS — I25111 Atherosclerotic heart disease of native coronary artery with angina pectoris with documented spasm: Secondary | ICD-10-CM | POA: Diagnosis not present

## 2015-03-04 DIAGNOSIS — E780 Pure hypercholesterolemia: Secondary | ICD-10-CM | POA: Diagnosis not present

## 2015-06-27 DIAGNOSIS — Z125 Encounter for screening for malignant neoplasm of prostate: Secondary | ICD-10-CM | POA: Diagnosis not present

## 2015-06-27 DIAGNOSIS — Z1211 Encounter for screening for malignant neoplasm of colon: Secondary | ICD-10-CM | POA: Diagnosis not present

## 2015-06-27 DIAGNOSIS — I119 Hypertensive heart disease without heart failure: Secondary | ICD-10-CM | POA: Diagnosis not present

## 2015-06-27 DIAGNOSIS — Z0000 Encounter for general adult medical examination without abnormal findings: Secondary | ICD-10-CM | POA: Diagnosis not present

## 2015-06-27 DIAGNOSIS — Z23 Encounter for immunization: Secondary | ICD-10-CM | POA: Diagnosis not present

## 2015-06-27 DIAGNOSIS — E782 Mixed hyperlipidemia: Secondary | ICD-10-CM | POA: Diagnosis not present

## 2015-09-30 DIAGNOSIS — I10 Essential (primary) hypertension: Secondary | ICD-10-CM | POA: Diagnosis not present

## 2015-09-30 DIAGNOSIS — E782 Mixed hyperlipidemia: Secondary | ICD-10-CM | POA: Diagnosis not present

## 2015-10-04 DIAGNOSIS — I25111 Atherosclerotic heart disease of native coronary artery with angina pectoris with documented spasm: Secondary | ICD-10-CM | POA: Diagnosis not present

## 2015-10-04 DIAGNOSIS — E782 Mixed hyperlipidemia: Secondary | ICD-10-CM | POA: Diagnosis not present

## 2015-10-04 DIAGNOSIS — I119 Hypertensive heart disease without heart failure: Secondary | ICD-10-CM | POA: Diagnosis not present

## 2016-01-24 DIAGNOSIS — I119 Hypertensive heart disease without heart failure: Secondary | ICD-10-CM | POA: Diagnosis not present

## 2016-01-24 DIAGNOSIS — E782 Mixed hyperlipidemia: Secondary | ICD-10-CM | POA: Diagnosis not present

## 2016-01-24 DIAGNOSIS — I25111 Atherosclerotic heart disease of native coronary artery with angina pectoris with documented spasm: Secondary | ICD-10-CM | POA: Diagnosis not present

## 2016-01-26 DIAGNOSIS — I25111 Atherosclerotic heart disease of native coronary artery with angina pectoris with documented spasm: Secondary | ICD-10-CM | POA: Diagnosis not present

## 2016-01-26 DIAGNOSIS — E782 Mixed hyperlipidemia: Secondary | ICD-10-CM | POA: Diagnosis not present

## 2016-01-26 DIAGNOSIS — I119 Hypertensive heart disease without heart failure: Secondary | ICD-10-CM | POA: Diagnosis not present

## 2016-02-06 DIAGNOSIS — H25813 Combined forms of age-related cataract, bilateral: Secondary | ICD-10-CM | POA: Diagnosis not present

## 2016-05-01 DIAGNOSIS — I10 Essential (primary) hypertension: Secondary | ICD-10-CM | POA: Diagnosis not present

## 2016-05-01 DIAGNOSIS — E782 Mixed hyperlipidemia: Secondary | ICD-10-CM | POA: Diagnosis not present

## 2016-05-03 DIAGNOSIS — Z23 Encounter for immunization: Secondary | ICD-10-CM | POA: Diagnosis not present

## 2016-05-03 DIAGNOSIS — E782 Mixed hyperlipidemia: Secondary | ICD-10-CM | POA: Diagnosis not present

## 2016-05-03 DIAGNOSIS — I119 Hypertensive heart disease without heart failure: Secondary | ICD-10-CM | POA: Diagnosis not present

## 2016-05-03 DIAGNOSIS — I25111 Atherosclerotic heart disease of native coronary artery with angina pectoris with documented spasm: Secondary | ICD-10-CM | POA: Diagnosis not present

## 2016-05-08 DIAGNOSIS — H25811 Combined forms of age-related cataract, right eye: Secondary | ICD-10-CM | POA: Diagnosis not present

## 2016-05-08 DIAGNOSIS — H268 Other specified cataract: Secondary | ICD-10-CM | POA: Diagnosis not present

## 2016-05-08 DIAGNOSIS — Z01818 Encounter for other preprocedural examination: Secondary | ICD-10-CM | POA: Diagnosis not present

## 2016-05-09 DIAGNOSIS — H25811 Combined forms of age-related cataract, right eye: Secondary | ICD-10-CM | POA: Diagnosis not present

## 2016-06-20 DIAGNOSIS — H25812 Combined forms of age-related cataract, left eye: Secondary | ICD-10-CM | POA: Diagnosis not present

## 2016-06-20 DIAGNOSIS — H268 Other specified cataract: Secondary | ICD-10-CM | POA: Diagnosis not present

## 2016-08-02 DIAGNOSIS — E782 Mixed hyperlipidemia: Secondary | ICD-10-CM | POA: Diagnosis not present

## 2016-08-02 DIAGNOSIS — I10 Essential (primary) hypertension: Secondary | ICD-10-CM | POA: Diagnosis not present

## 2016-08-06 DIAGNOSIS — I119 Hypertensive heart disease without heart failure: Secondary | ICD-10-CM | POA: Diagnosis not present

## 2016-08-06 DIAGNOSIS — Z125 Encounter for screening for malignant neoplasm of prostate: Secondary | ICD-10-CM | POA: Diagnosis not present

## 2016-08-06 DIAGNOSIS — I25111 Atherosclerotic heart disease of native coronary artery with angina pectoris with documented spasm: Secondary | ICD-10-CM | POA: Diagnosis not present

## 2016-08-06 DIAGNOSIS — Z1389 Encounter for screening for other disorder: Secondary | ICD-10-CM | POA: Diagnosis not present

## 2016-08-06 DIAGNOSIS — E782 Mixed hyperlipidemia: Secondary | ICD-10-CM | POA: Diagnosis not present

## 2016-08-15 DIAGNOSIS — Z1211 Encounter for screening for malignant neoplasm of colon: Secondary | ICD-10-CM | POA: Diagnosis not present

## 2016-08-15 DIAGNOSIS — Z6830 Body mass index (BMI) 30.0-30.9, adult: Secondary | ICD-10-CM | POA: Diagnosis not present

## 2016-08-15 DIAGNOSIS — Z0000 Encounter for general adult medical examination without abnormal findings: Secondary | ICD-10-CM | POA: Diagnosis not present

## 2016-08-15 DIAGNOSIS — Z125 Encounter for screening for malignant neoplasm of prostate: Secondary | ICD-10-CM | POA: Diagnosis not present

## 2016-11-05 DIAGNOSIS — E782 Mixed hyperlipidemia: Secondary | ICD-10-CM | POA: Diagnosis not present

## 2016-11-05 DIAGNOSIS — I10 Essential (primary) hypertension: Secondary | ICD-10-CM | POA: Diagnosis not present

## 2016-11-07 DIAGNOSIS — I2510 Atherosclerotic heart disease of native coronary artery without angina pectoris: Secondary | ICD-10-CM | POA: Diagnosis not present

## 2016-11-07 DIAGNOSIS — I119 Hypertensive heart disease without heart failure: Secondary | ICD-10-CM | POA: Diagnosis not present

## 2016-11-07 DIAGNOSIS — E782 Mixed hyperlipidemia: Secondary | ICD-10-CM | POA: Diagnosis not present

## 2017-03-15 ENCOUNTER — Other Ambulatory Visit: Payer: Self-pay

## 2017-03-22 DIAGNOSIS — E782 Mixed hyperlipidemia: Secondary | ICD-10-CM | POA: Diagnosis not present

## 2017-03-22 DIAGNOSIS — I119 Hypertensive heart disease without heart failure: Secondary | ICD-10-CM | POA: Diagnosis not present

## 2017-03-26 DIAGNOSIS — I2510 Atherosclerotic heart disease of native coronary artery without angina pectoris: Secondary | ICD-10-CM | POA: Diagnosis not present

## 2017-03-26 DIAGNOSIS — I119 Hypertensive heart disease without heart failure: Secondary | ICD-10-CM | POA: Diagnosis not present

## 2017-03-26 DIAGNOSIS — E782 Mixed hyperlipidemia: Secondary | ICD-10-CM | POA: Diagnosis not present

## 2017-05-24 DIAGNOSIS — Z23 Encounter for immunization: Secondary | ICD-10-CM | POA: Diagnosis not present

## 2017-05-30 DIAGNOSIS — Z961 Presence of intraocular lens: Secondary | ICD-10-CM | POA: Diagnosis not present

## 2017-07-01 DIAGNOSIS — E782 Mixed hyperlipidemia: Secondary | ICD-10-CM | POA: Diagnosis not present

## 2017-07-01 DIAGNOSIS — R7301 Impaired fasting glucose: Secondary | ICD-10-CM | POA: Diagnosis not present

## 2017-07-04 DIAGNOSIS — E782 Mixed hyperlipidemia: Secondary | ICD-10-CM | POA: Diagnosis not present

## 2017-07-04 DIAGNOSIS — I2510 Atherosclerotic heart disease of native coronary artery without angina pectoris: Secondary | ICD-10-CM | POA: Diagnosis not present

## 2017-07-04 DIAGNOSIS — I119 Hypertensive heart disease without heart failure: Secondary | ICD-10-CM | POA: Diagnosis not present

## 2017-07-04 DIAGNOSIS — Z6830 Body mass index (BMI) 30.0-30.9, adult: Secondary | ICD-10-CM | POA: Diagnosis not present

## 2017-07-04 DIAGNOSIS — E663 Overweight: Secondary | ICD-10-CM | POA: Diagnosis not present

## 2017-07-31 DIAGNOSIS — I119 Hypertensive heart disease without heart failure: Secondary | ICD-10-CM | POA: Diagnosis not present

## 2017-07-31 DIAGNOSIS — I10 Essential (primary) hypertension: Secondary | ICD-10-CM | POA: Diagnosis not present

## 2017-10-08 DIAGNOSIS — E782 Mixed hyperlipidemia: Secondary | ICD-10-CM | POA: Diagnosis not present

## 2017-10-08 DIAGNOSIS — E663 Overweight: Secondary | ICD-10-CM | POA: Diagnosis not present

## 2017-10-08 DIAGNOSIS — Z6830 Body mass index (BMI) 30.0-30.9, adult: Secondary | ICD-10-CM | POA: Diagnosis not present

## 2017-10-08 DIAGNOSIS — R7301 Impaired fasting glucose: Secondary | ICD-10-CM | POA: Diagnosis not present

## 2017-10-08 DIAGNOSIS — I119 Hypertensive heart disease without heart failure: Secondary | ICD-10-CM | POA: Diagnosis not present

## 2017-10-08 DIAGNOSIS — I2510 Atherosclerotic heart disease of native coronary artery without angina pectoris: Secondary | ICD-10-CM | POA: Diagnosis not present

## 2018-01-06 DIAGNOSIS — R7301 Impaired fasting glucose: Secondary | ICD-10-CM | POA: Diagnosis not present

## 2018-01-06 DIAGNOSIS — I10 Essential (primary) hypertension: Secondary | ICD-10-CM | POA: Diagnosis not present

## 2018-01-06 DIAGNOSIS — E782 Mixed hyperlipidemia: Secondary | ICD-10-CM | POA: Diagnosis not present

## 2018-01-08 DIAGNOSIS — I119 Hypertensive heart disease without heart failure: Secondary | ICD-10-CM | POA: Diagnosis not present

## 2018-01-08 DIAGNOSIS — I2510 Atherosclerotic heart disease of native coronary artery without angina pectoris: Secondary | ICD-10-CM | POA: Diagnosis not present

## 2018-01-08 DIAGNOSIS — E782 Mixed hyperlipidemia: Secondary | ICD-10-CM | POA: Diagnosis not present

## 2018-01-08 DIAGNOSIS — Z6830 Body mass index (BMI) 30.0-30.9, adult: Secondary | ICD-10-CM | POA: Diagnosis not present

## 2018-01-08 DIAGNOSIS — E663 Overweight: Secondary | ICD-10-CM | POA: Diagnosis not present

## 2018-03-10 DIAGNOSIS — Z6830 Body mass index (BMI) 30.0-30.9, adult: Secondary | ICD-10-CM | POA: Diagnosis not present

## 2018-03-10 DIAGNOSIS — Z0000 Encounter for general adult medical examination without abnormal findings: Secondary | ICD-10-CM | POA: Diagnosis not present

## 2018-03-10 DIAGNOSIS — E668 Other obesity: Secondary | ICD-10-CM | POA: Diagnosis not present

## 2018-07-09 DIAGNOSIS — E782 Mixed hyperlipidemia: Secondary | ICD-10-CM | POA: Diagnosis not present

## 2018-07-09 DIAGNOSIS — I119 Hypertensive heart disease without heart failure: Secondary | ICD-10-CM | POA: Diagnosis not present

## 2018-07-11 DIAGNOSIS — Z6829 Body mass index (BMI) 29.0-29.9, adult: Secondary | ICD-10-CM | POA: Diagnosis not present

## 2018-07-11 DIAGNOSIS — E663 Overweight: Secondary | ICD-10-CM | POA: Diagnosis not present

## 2018-07-11 DIAGNOSIS — I2510 Atherosclerotic heart disease of native coronary artery without angina pectoris: Secondary | ICD-10-CM | POA: Diagnosis not present

## 2018-07-11 DIAGNOSIS — Z23 Encounter for immunization: Secondary | ICD-10-CM | POA: Diagnosis not present

## 2018-07-11 DIAGNOSIS — I119 Hypertensive heart disease without heart failure: Secondary | ICD-10-CM | POA: Diagnosis not present

## 2018-07-11 DIAGNOSIS — E782 Mixed hyperlipidemia: Secondary | ICD-10-CM | POA: Diagnosis not present

## 2018-07-24 DIAGNOSIS — Z961 Presence of intraocular lens: Secondary | ICD-10-CM | POA: Diagnosis not present

## 2019-01-15 DIAGNOSIS — I2510 Atherosclerotic heart disease of native coronary artery without angina pectoris: Secondary | ICD-10-CM | POA: Diagnosis not present

## 2019-01-15 DIAGNOSIS — I119 Hypertensive heart disease without heart failure: Secondary | ICD-10-CM | POA: Diagnosis not present

## 2019-01-15 DIAGNOSIS — E663 Overweight: Secondary | ICD-10-CM | POA: Diagnosis not present

## 2019-01-15 DIAGNOSIS — Z6829 Body mass index (BMI) 29.0-29.9, adult: Secondary | ICD-10-CM | POA: Diagnosis not present

## 2019-01-15 DIAGNOSIS — E782 Mixed hyperlipidemia: Secondary | ICD-10-CM | POA: Diagnosis not present

## 2019-04-17 DIAGNOSIS — Z0000 Encounter for general adult medical examination without abnormal findings: Secondary | ICD-10-CM | POA: Diagnosis not present

## 2019-04-17 DIAGNOSIS — Z6828 Body mass index (BMI) 28.0-28.9, adult: Secondary | ICD-10-CM | POA: Diagnosis not present

## 2019-05-18 DIAGNOSIS — I2510 Atherosclerotic heart disease of native coronary artery without angina pectoris: Secondary | ICD-10-CM | POA: Diagnosis not present

## 2019-05-18 DIAGNOSIS — Z6829 Body mass index (BMI) 29.0-29.9, adult: Secondary | ICD-10-CM | POA: Diagnosis not present

## 2019-05-18 DIAGNOSIS — E663 Overweight: Secondary | ICD-10-CM | POA: Diagnosis not present

## 2019-05-18 DIAGNOSIS — Z23 Encounter for immunization: Secondary | ICD-10-CM | POA: Diagnosis not present

## 2019-05-18 DIAGNOSIS — E782 Mixed hyperlipidemia: Secondary | ICD-10-CM | POA: Diagnosis not present

## 2019-05-18 DIAGNOSIS — I119 Hypertensive heart disease without heart failure: Secondary | ICD-10-CM | POA: Diagnosis not present

## 2019-10-02 DIAGNOSIS — Z23 Encounter for immunization: Secondary | ICD-10-CM | POA: Diagnosis not present

## 2019-10-30 DIAGNOSIS — Z23 Encounter for immunization: Secondary | ICD-10-CM | POA: Diagnosis not present

## 2019-11-16 ENCOUNTER — Ambulatory Visit: Payer: Self-pay

## 2019-11-27 ENCOUNTER — Ambulatory Visit: Payer: Medicare Other

## 2019-11-27 ENCOUNTER — Other Ambulatory Visit: Payer: Self-pay

## 2019-11-27 DIAGNOSIS — I119 Hypertensive heart disease without heart failure: Secondary | ICD-10-CM | POA: Diagnosis not present

## 2019-11-27 DIAGNOSIS — E782 Mixed hyperlipidemia: Secondary | ICD-10-CM

## 2019-11-28 LAB — COMPREHENSIVE METABOLIC PANEL
ALT: 26 IU/L (ref 0–44)
AST: 25 IU/L (ref 0–40)
Albumin/Globulin Ratio: 1.9 (ref 1.2–2.2)
Albumin: 4.3 g/dL (ref 3.7–4.7)
Alkaline Phosphatase: 75 IU/L (ref 39–117)
BUN/Creatinine Ratio: 17 (ref 10–24)
BUN: 18 mg/dL (ref 8–27)
Bilirubin Total: 0.5 mg/dL (ref 0.0–1.2)
CO2: 19 mmol/L — ABNORMAL LOW (ref 20–29)
Calcium: 9.4 mg/dL (ref 8.6–10.2)
Chloride: 105 mmol/L (ref 96–106)
Creatinine, Ser: 1.03 mg/dL (ref 0.76–1.27)
GFR calc Af Amer: 82 mL/min/{1.73_m2} (ref >59)
GFR calc non Af Amer: 71 mL/min/{1.73_m2} (ref >59)
Globulin, Total: 2.3 g/dL (ref 1.5–4.5)
Glucose: 91 mg/dL (ref 65–99)
Potassium: 4.8 mmol/L (ref 3.5–5.2)
Sodium: 140 mmol/L (ref 134–144)
Total Protein: 6.6 g/dL (ref 6.0–8.5)

## 2019-11-28 LAB — LIPID PANEL W/O CHOL/HDL RATIO
Cholesterol, Total: 137 mg/dL (ref 100–199)
HDL: 33 mg/dL — ABNORMAL LOW (ref >39)
LDL Chol Calc (NIH): 75 mg/dL (ref 0–99)
Triglycerides: 167 mg/dL — ABNORMAL HIGH (ref 0–149)
VLDL Cholesterol Cal: 29 mg/dL (ref 5–40)

## 2019-11-28 LAB — CBC WITH DIFFERENTIAL/PLATELET
Basophils Absolute: 0.1 10*3/uL (ref 0.0–0.2)
Basos: 1 %
EOS (ABSOLUTE): 0.4 10*3/uL (ref 0.0–0.4)
Eos: 6 %
Hematocrit: 44.8 % (ref 37.5–51.0)
Hemoglobin: 15.1 g/dL (ref 13.0–17.7)
Immature Grans (Abs): 0 10*3/uL (ref 0.0–0.1)
Immature Granulocytes: 0 %
Lymphocytes Absolute: 1.4 10*3/uL (ref 0.7–3.1)
Lymphs: 18 %
MCH: 29.7 pg (ref 26.6–33.0)
MCHC: 33.7 g/dL (ref 31.5–35.7)
MCV: 88 fL (ref 79–97)
Monocytes Absolute: 0.7 10*3/uL (ref 0.1–0.9)
Monocytes: 9 %
Neutrophils Absolute: 5 10*3/uL (ref 1.4–7.0)
Neutrophils: 66 %
Platelets: 182 10*3/uL (ref 150–450)
RBC: 5.09 x10E6/uL (ref 4.14–5.80)
RDW: 13 % (ref 11.6–15.4)
WBC: 7.6 10*3/uL (ref 3.4–10.8)

## 2019-11-28 LAB — CARDIOVASCULAR RISK ASSESSMENT

## 2019-12-02 ENCOUNTER — Encounter: Payer: Self-pay | Admitting: Family Medicine

## 2019-12-02 NOTE — Progress Notes (Signed)
Established Patient Office Visit  Subjective:  Patient ID: Bryan Zimmerman, male    DOB: 12/12/1945  Age: 74 y.o. MRN: [id number]  CC:  Chief Complaint  Patient presents with  . Hyperlipidemia  . Hypertension    HPI Pt presents with hyperlipidemia.  He cannot recall when the diagnosis of hypercholesterolemia was made.  Current treatment includes Pravachol and coenzyme q 10..  Compliance with treatment has been good; he takes his medication as directed, maintains his low cholesterol diet, and follows up as directed.  He denies experiencing any hypercholesterolemia related symptoms.      For his hypertension, his current cardiac medication regimen includes carvedilol and lisinopril.  He is tolerating the medication well without side effects.  Compliance with treatment has been good; he takes his medication as directed and follows up as directed.      With regard to the atherosclerotic heart disease of native coronary artery without angina pectoris, he is here today for routine follow-up.  Bryan Zimmerman has a prior history of a myocardial infarction and is currently on a beta blocker.  His heart disease was first diagnosed 5 years ago.  The course of the disease has been stable.  Currently, his treatment regimen consists of daily 81 mg aspirin, an ACEI ( lisinopril ),  Coreg, and Pravachol.  No associated symptoms are reported.  Current level of activity includes ability to walk and perform moderate exercise.  Bryan Zimmerman is compliant with low fat diet and taking medications as recommended and prescribed.    Past Medical History:  Diagnosis Date  . CAD (coronary artery disease)   . History of heart attack   . History of kidney stones   . Hypertensive heart disease without heart failure   . Mixed hyperlipidemia   . Osteoarthritis       Family History  Problem Relation Age of Onset  . Dementia Mother   . Cancer Father        pancreatic  . Hypertension Other   . Hyperlipidemia Other      Social History   Socioeconomic History  . Marital status: Married    Spouse name: Not on file  . Number of children: Not on file  . Years of education: Not on file  . Highest education level: Not on file  Occupational History  . Not on file  Tobacco Use  . Smoking status: Former Smoker    Types: Cigarettes  . Smokeless tobacco: Never Used  Substance and Sexual Activity  . Alcohol use: Never  . Drug use: Never  . Sexual activity: Not on file  Other Topics Concern  . Not on file  Social History Narrative  . Not on file   Social Determinants of Health   Financial Resource Strain:   . Difficulty of Paying Living Expenses:   Food Insecurity:   . Worried About Charity fundraiser in the Last Year:   . Arboriculturist in the Last Year:   Transportation Needs:   . Film/video editor (Medical):   Marland Kitchen Lack of Transportation (Non-Medical):   Physical Activity:   . Days of Exercise per Week:   . Minutes of Exercise per Session:   Stress:   . Feeling of Stress :   Social Connections:   . Frequency of Communication with Friends and Family:   . Frequency of Social Gatherings with Friends and Family:   . Attends Religious Services:   . Active Member of Clubs or Organizations:   .  Attends Archivist Meetings:   Marland Kitchen Marital Status:   Intimate Partner Violence:   . Fear of Current or Ex-Partner:   . Emotionally Abused:   Marland Kitchen Physically Abused:   . Sexually Abused:     Outpatient Medications Prior to Visit  Medication Sig Dispense Refill  . Boswellia-Glucosamine-Vit D (OSTEO BI-FLEX ONE PER DAY PO) Take by mouth.    . carvedilol (COREG) 6.25 MG tablet Take 6.25 mg by mouth 2 (two) times daily.    . cetirizine (ZYRTEC) 10 MG tablet Take 10 mg by mouth daily.    Marland Kitchen co-enzyme Q-10 30 MG capsule Take 100 mg by mouth 3 (three) times daily.    Marland Kitchen lisinopril (ZESTRIL) 40 MG tablet Take 40 mg by mouth 2 (two) times daily.    . nitroGLYCERIN (NITROSTAT) 0.4 MG SL tablet  Place 0.4 mg under the tongue every 5 (five) minutes as needed for chest pain.    . pravastatin (PRAVACHOL) 80 MG tablet Take 80 mg by mouth at bedtime.     No facility-administered medications prior to visit.    Not on File  ROS Review of Systems  Constitutional: Negative for chills, diaphoresis, fatigue and fever.  HENT: Negative for congestion, ear pain and sore throat.   Respiratory: Negative for cough and shortness of breath.   Cardiovascular: Negative for chest pain and leg swelling.  Gastrointestinal: Negative for abdominal pain, constipation, diarrhea, nausea and vomiting.  Genitourinary: Negative for dysuria and urgency.  Musculoskeletal: Negative for arthralgias and myalgias.  Neurological: Negative for dizziness and headaches.  Psychiatric/Behavioral: Negative for dysphoric mood.      Objective:    Physical Exam  Constitutional: He is oriented to person, place, and time. He appears well-developed and well-nourished.  Cardiovascular: Normal rate, regular rhythm and normal heart sounds.  No murmur heard. No carotid bruits  Pulmonary/Chest: Effort normal and breath sounds normal. No respiratory distress.  Abdominal: Soft. Bowel sounds are normal. There is no abdominal tenderness.  Neurological: He is alert and oriented to person, place, and time.  Psychiatric: He has a normal mood and affect. His behavior is normal.    BP 124/60   Pulse 78   Temp 97.9 F (36.6 C)   Resp 16   Ht 5\' 9"  (1.753 m)   Wt 201 lb 12.8 oz (91.5 kg)   BMI 29.80 kg/m  Wt Readings from Last 3 Encounters:  12/03/19 201 lb 12.8 oz (91.5 kg)     Health Maintenance Due  Topic Date Due  . Hepatitis C Screening  Never done  . COLONOSCOPY  Never done  . PNA vac Low Risk Adult (1 of 2 - PCV13) Never done  . COVID-19 Vaccine (2 - Moderna 2-dose series) 10/30/2019    There are no preventive care reminders to display for this patient.  No results found for: TSH Lab Results  Component  Value Date   WBC 7.6 11/27/2019   HGB 15.1 11/27/2019   HCT 44.8 11/27/2019   MCV 88 11/27/2019   PLT 182 11/27/2019   Lab Results  Component Value Date   NA 140 11/27/2019   K 4.8 11/27/2019   CO2 19 (L) 11/27/2019   GLUCOSE 91 11/27/2019   BUN 18 11/27/2019   CREATININE 1.03 11/27/2019   BILITOT 0.5 11/27/2019   ALKPHOS 75 11/27/2019   AST 25 11/27/2019   ALT 26 11/27/2019   PROT 6.6 11/27/2019   ALBUMIN 4.3 11/27/2019   CALCIUM 9.4 11/27/2019   Lab Results  Component Value Date   CHOL 137 11/27/2019   Lab Results  Component Value Date   HDL 33 (L) 11/27/2019   Lab Results  Component Value Date   LDLCALC 75 11/27/2019   Lab Results  Component Value Date   TRIG 167 (H) 11/27/2019   No results found for: CHOLHDL No results found for: HGBA1C    Assessment & Plan:   1. Mixed hyperlipidemia Well controlled.  No changes to medicines.  Continue to work on eating a healthy diet and exercise.  Labs reviewed today.   2. Hypertensive heart disease without heart failure Well controlled.  No changes to medicines.  Continue to work on eating a healthy diet and exercise.  Labs reviewed today.   3. Overweight with bmi 29.  Recommend eat healthy and exercise  Follow-up: Return in about 6 months (around 06/03/2020) for fasting. AWV due in September 2021 with Maudie Mercury, LPN.Marland Kitchen    Rochel Brome, MD

## 2019-12-03 ENCOUNTER — Ambulatory Visit (INDEPENDENT_AMBULATORY_CARE_PROVIDER_SITE_OTHER): Payer: Medicare Other | Admitting: Family Medicine

## 2019-12-03 ENCOUNTER — Other Ambulatory Visit: Payer: Self-pay

## 2019-12-03 VITALS — BP 124/60 | HR 78 | Temp 97.9°F | Resp 16 | Ht 69.0 in | Wt 201.8 lb

## 2019-12-03 DIAGNOSIS — E663 Overweight: Secondary | ICD-10-CM | POA: Diagnosis not present

## 2019-12-03 DIAGNOSIS — E782 Mixed hyperlipidemia: Secondary | ICD-10-CM | POA: Diagnosis not present

## 2019-12-03 DIAGNOSIS — Z6829 Body mass index (BMI) 29.0-29.9, adult: Secondary | ICD-10-CM

## 2019-12-03 DIAGNOSIS — I119 Hypertensive heart disease without heart failure: Secondary | ICD-10-CM | POA: Diagnosis not present

## 2019-12-03 MED ORDER — PRAVASTATIN SODIUM 40 MG PO TABS: 40.0000 mg | ORAL_TABLET | Freq: Every day | ORAL | 3 refills | Status: DC

## 2019-12-03 MED ORDER — FENOFIBRATE 160 MG PO TABS: 160.0000 mg | ORAL_TABLET | Freq: Every day | ORAL | 1 refills | Status: DC

## 2019-12-03 NOTE — Patient Instructions (Addendum)
Hypertension well controlled.  High Cholesterol - Decreaes pravastatin 40 mg once daily. Start fenofibrate 160 mg once daily.  Continue to work on eating healthy and exercising.    Results for Bryan Zimmerman, Bryan Zimmerman (MRN [id number]) as of 12/03/2019 14:40  Ref. Range 11/27/2019 08:36  COMPREHENSIVE METABOLIC PANEL Unknown Rpt (A)  Sodium Latest Ref Range: 134 - 144 mmol/L 140  Potassium Latest Ref Range: 3.5 - 5.2 mmol/L 4.8  Chloride Latest Ref Range: 96 - 106 mmol/L 105  CO2 Latest Ref Range: 20 - 29 mmol/L 19 (L)  Glucose Latest Ref Range: 65 - 99 mg/dL 91  BUN Latest Ref Range: 8 - 27 mg/dL 18  Creatinine Latest Ref Range: 0.76 - 1.27 mg/dL 1.03  Calcium Latest Ref Range: 8.6 - 10.2 mg/dL 9.4  BUN/Creatinine Ratio Latest Ref Range: 10 - 24  17  Alkaline Phosphatase Latest Ref Range: 39 - 117 IU/L 75  Albumin Latest Ref Range: 3.7 - 4.7 g/dL 4.3  Albumin/Globulin Ratio Latest Ref Range: 1.2 - 2.2  1.9  AST Latest Ref Range: 0 - 40 IU/L 25  ALT Latest Ref Range: 0 - 44 IU/L 26  Total Protein Latest Ref Range: 6.0 - 8.5 g/dL 6.6  Total Bilirubin Latest Ref Range: 0.0 - 1.2 mg/dL 0.5  GFR, Est Non African American Latest Ref Range: >59 mL/min/1.73 71  GFR, Est African American Latest Ref Range: >59 mL/min/1.73 82  Cholesterol, Total Latest Ref Range: 100 - 199 mg/dL 137  HDL Cholesterol Latest Ref Range: >39 mg/dL 33 (L)  Triglycerides Latest Ref Range: 0 - 149 mg/dL 167 (H)  VLDL Cholesterol Cal Latest Ref Range: 5 - 40 mg/dL 29  LDL Chol Calc (NIH) Latest Ref Range: 0 - 99 mg/dL 75  Interpretation Unknown Note  Globulin, Total Latest Ref Range: 1.5 - 4.5 g/dL 2.3  WBC Latest Ref Range: 3.4 - 10.8 x10E3/uL 7.6  RBC Latest Ref Range: 4.14 - 5.80 x10E6/uL 5.09  Hemoglobin Latest Ref Range: 13.0 - 17.7 g/dL 15.1  HCT Latest Ref Range: 37.5 - 51.0 % 44.8  MCV Latest Ref Range: 79 - 97 fL 88  MCH Latest Ref Range: 26.6 - 33.0 pg 29.7  MCHC Latest Ref Range: 31.5 - 35.7 g/dL 33.7  RDW  Latest Ref Range: 11.6 - 15.4 % 13.0  Platelets Latest Ref Range: 150 - 450 x10E3/uL 182  Neutrophils Latest Ref Range: Not Estab. % 66  Immature Granulocytes Latest Ref Range: Not Estab. % 0  NEUT# Latest Ref Range: 1.4 - 7.0 x10E3/uL 5.0  Lymphocyte # Latest Ref Range: 0.7 - 3.1 x10E3/uL 1.4  Monocytes Absolute Latest Ref Range: 0.1 - 0.9 x10E3/uL 0.7  Basophils Absolute Latest Ref Range: 0.0 - 0.2 x10E3/uL 0.1  Immature Grans (Abs) Latest Ref Range: 0.0 - 0.1 x10E3/uL 0.0  Lymphs Latest Ref Range: Not Estab. % 18  Monocytes Latest Ref Range: Not Estab. % 9  Basos Latest Ref Range: Not Estab. % 1  Eos Latest Ref Range: Not Estab. % 6  EOS (ABSOLUTE) Latest Ref Range: 0.0 - 0.4 x10E3/uL 0.4

## 2019-12-04 ENCOUNTER — Other Ambulatory Visit: Payer: Self-pay | Admitting: Family Medicine

## 2019-12-09 ENCOUNTER — Telehealth: Payer: Self-pay

## 2019-12-09 NOTE — Telephone Encounter (Signed)
[facility] called about a possible drug interaction between Fenofibrate and Pravastatin.  Reference # [id number].  Dr. Tobie Poet is aware and wants to continue the medication as prescribed.

## 2019-12-13 ENCOUNTER — Encounter: Payer: Self-pay | Admitting: Family Medicine

## 2019-12-13 DIAGNOSIS — E663 Overweight: Secondary | ICD-10-CM | POA: Insufficient documentation

## 2019-12-13 DIAGNOSIS — I119 Hypertensive heart disease without heart failure: Secondary | ICD-10-CM | POA: Insufficient documentation

## 2019-12-13 DIAGNOSIS — E782 Mixed hyperlipidemia: Secondary | ICD-10-CM | POA: Insufficient documentation

## 2020-03-21 ENCOUNTER — Ambulatory Visit (INDEPENDENT_AMBULATORY_CARE_PROVIDER_SITE_OTHER): Payer: Medicare Other | Admitting: Family Medicine

## 2020-03-21 ENCOUNTER — Other Ambulatory Visit: Payer: Self-pay

## 2020-03-21 ENCOUNTER — Encounter: Payer: Self-pay | Admitting: Family Medicine

## 2020-03-21 VITALS — BP 154/64 | HR 74 | Temp 98.4°F | Resp 18 | Ht 69.0 in | Wt 198.8 lb

## 2020-03-21 DIAGNOSIS — L578 Other skin changes due to chronic exposure to nonionizing radiation: Secondary | ICD-10-CM

## 2020-03-21 DIAGNOSIS — M79674 Pain in right toe(s): Secondary | ICD-10-CM

## 2020-03-21 MED ORDER — TRIAMCINOLONE ACETONIDE 0.1 % EX CREA: 1.0000 "application " | TOPICAL_CREAM | Freq: Two times a day (BID) | CUTANEOUS | 0 refills | Status: DC

## 2020-03-21 NOTE — Progress Notes (Signed)
Acute Office Visit  Subjective:    Patient ID: Bryan Zimmerman, male    DOB: 12-23-1945, 74 y.o.   MRN: [id number]  Chief Complaint  Patient presents with  . redness and swelling right great toe    HPI Patient is in today for redness and swelling of right fourth toe. This has been going on a month. It has improved, but not fully resolved. His wife lanced it with a sterile needle and white drainage. No fever. Treated as gout using cherry juice, increase water.   Patient went to beach and the patient fell down ladder. Scraping right anterior shin. Healed now. Did not see a doctor at the time, but they washed it clearly.  Patient developed rash on arms, neck that occurred after being at the beach. Was itchy. Using hydrocortisone cream. Improved, but not gone. Occurred after being in the sun, although he is frequently in the sun and has not had this occur.   Past Medical History:  Diagnosis Date  . CAD (coronary artery disease)   . History of heart attack   . History of kidney stones   . Hypertensive heart disease without heart failure   . Mixed hyperlipidemia   . Osteoarthritis     Past Surgical History:  Procedure Laterality Date  . CATARACT EXTRACTION, BILATERAL    . HERNIA REPAIR    . 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 stints      Family History  Problem Relation Age of Onset  . Dementia Mother   . Cancer Father        pancreatic  . Hypertension Other   . Hyperlipidemia Other     Social History   Socioeconomic History  . Marital status: Married    Spouse name: Not on file  . Number of children: Not on file  . Years of education: Not on file  . Highest education level: Not on file  Occupational History  . Not on file  Tobacco Use  . Smoking status: Former Smoker    Types: Cigarettes  . Smokeless tobacco: Never Used  Substance and Sexual Activity  . Alcohol use: Never  . Drug use: Never  . Sexual activity: Not on file  Other Topics Concern  .  Not on file  Social History Narrative  . Not on file   Social Determinants of Health   Financial Resource Strain:   . Difficulty of Paying Living Expenses:   Food Insecurity:   . Worried About Charity fundraiser in the Last Year:   . Arboriculturist in the Last Year:   Transportation Needs:   . Film/video editor (Medical):   Marland Kitchen Lack of Transportation (Non-Medical):   Physical Activity:   . Days of Exercise per Week:   . Minutes of Exercise per Session:   Stress:   . Feeling of Stress :   Social Connections:   . Frequency of Communication with Friends and Family:   . Frequency of Social Gatherings with Friends and Family:   . Attends Religious Services:   . Active Member of Clubs or Organizations:   . Attends Archivist Meetings:   Marland Kitchen Marital Status:   Intimate Partner Violence:   . Fear of Current or Ex-Partner:   . Emotionally Abused:   Marland Kitchen Physically Abused:   . Sexually Abused:     Outpatient Medications Prior to Visit  Medication Sig Dispense Refill  . aspirin EC 81 MG tablet Take 81 mg  by mouth daily. Swallow whole.    . Boswellia-Glucosamine-Vit D (OSTEO BI-FLEX ONE PER DAY PO) Take by mouth.    . carvedilol (COREG) 6.25 MG tablet Take 6.25 mg by mouth 2 (two) times daily.    . cetirizine (ZYRTEC) 10 MG tablet Take 10 mg by mouth daily.    Marland Kitchen co-enzyme Q-10 30 MG capsule Take 100 mg by mouth 3 (three) times daily.    . fenofibrate 160 MG tablet TAKE 1 TABLET DAILY. 90 tablet 3  . lisinopril (ZESTRIL) 40 MG tablet Take 40 mg by mouth 2 (two) times daily.    . Misc Natural Products (PROSTATE SUPPORT) 300-15 MG TABS Take by mouth.    . nitroGLYCERIN (NITROSTAT) 0.4 MG SL tablet Place 0.4 mg under the tongue every 5 (five) minutes as needed for chest pain.    . pravastatin (PRAVACHOL) 40 MG tablet Take 1 tablet (40 mg total) by mouth daily. 90 tablet 3   No facility-administered medications prior to visit.    No Known Allergies  Review of Systems    Constitutional: Negative for chills and fever.  Respiratory: Negative for cough and shortness of breath.   Cardiovascular: Negative for chest pain and palpitations.  Gastrointestinal: Positive for abdominal pain.  Musculoskeletal: Positive for arthralgias (redness and swelling fourth toe right foot. ).  Psychiatric/Behavioral: Negative for dysphoric mood. The patient is not nervous/anxious.        Objective:    Physical Exam Vitals reviewed.  Constitutional:      Appearance: Normal appearance.  Neck:     Vascular: No carotid bruit.  Cardiovascular:     Rate and Rhythm: Normal rate and regular rhythm.     Pulses: Normal pulses.     Heart sounds: Normal heart sounds.  Pulmonary:     Effort: Pulmonary effort is normal.     Breath sounds: Normal breath sounds. No wheezing, rhonchi or rales.  Abdominal:     General: Bowel sounds are normal.     Palpations: Abdomen is soft.     Tenderness: There is no abdominal tenderness.  Musculoskeletal:     Comments: Rt fourth toe dorsal side. Symmetrical swelling. Rubbery, edges well defined and circularly. Erythema. Nontender.  Peeling skin surrounding the lesion.  Skin:    Findings: Rash (arms/legs - raised erythematous serpiginous.) present.  Neurological:     Mental Status: He is alert.  Psychiatric:        Mood and Affect: Mood normal.        Behavior: Behavior normal.     BP (!) 154/64   Pulse 74   Temp 98.4 F (36.9 C)   Resp 18   Ht 5\' 9"  (1.753 m)   Wt 198 lb 12.8 oz (90.2 kg)   BMI 29.36 kg/m  Wt Readings from Last 3 Encounters:  03/21/20 198 lb 12.8 oz (90.2 kg)  12/03/19 201 lb 12.8 oz (91.5 kg)    Health Maintenance Due  Topic Date Due  . Hepatitis C Screening  Never done  . COLONOSCOPY  Never done  . PNA vac Low Risk Adult (1 of 2 - PCV13) Never done  . COVID-19 Vaccine (2 - Moderna 2-dose series) 10/30/2019  . INFLUENZA VACCINE  03/20/2020    There are no preventive care reminders to display for this  patient.   No results found for: TSH Lab Results  Component Value Date   WBC 7.6 11/27/2019   HGB 15.1 11/27/2019   HCT 44.8 11/27/2019   MCV 88  11/27/2019   PLT 182 11/27/2019   Lab Results  Component Value Date   NA 140 11/27/2019   K 4.8 11/27/2019   CO2 19 (L) 11/27/2019   GLUCOSE 91 11/27/2019   BUN 18 11/27/2019   CREATININE 1.03 11/27/2019   BILITOT 0.5 11/27/2019   ALKPHOS 75 11/27/2019   AST 25 11/27/2019   ALT 26 11/27/2019   PROT 6.6 11/27/2019   ALBUMIN 4.3 11/27/2019   CALCIUM 9.4 11/27/2019   Lab Results  Component Value Date   CHOL 137 11/27/2019   Lab Results  Component Value Date   HDL 33 (L) 11/27/2019   Lab Results  Component Value Date   LDLCALC 75 11/27/2019   Lab Results  Component Value Date   TRIG 167 (H) 11/27/2019   No results found for: CHOLHDL No results found for: HGBA1C     Assessment & Plan:  1. Pain of toe of right foot: DDX: gout or infection, but does not hurt so this is less likely. Concerning for a cyst which may have become inflamed.  Refer to dermatology. Patient's wife wishes to call [facility] dermatology herself. His wife called back and was concerned because it was becoming redder. She requested an antibiotic.  Start on keflex 500 mg one twice a day x 7 days.  - CBC with Differential/Platelet - Uric Acid  2. Dermatitis due to sunburn Improving. Refer to dermatology. - triamcinolone cream (KENALOG) 0.1 %; Apply 1 application topically 2 (two) times daily.  Dispense: 80 g; Refill: 0   Meds ordered this encounter  Medications  . triamcinolone cream (KENALOG) 0.1 %    Sig: Apply 1 application topically 2 (two) times daily.    Dispense:  80 g    Refill:  0    Orders Placed This Encounter  Procedures  . CBC with Differential/Platelet  . Uric Acid     Follow-up: No follow-ups on file.  An After Visit Summary was printed and given to the patient.  Rochel Brome [facility] [phone]

## 2020-03-22 LAB — CBC WITH DIFFERENTIAL/PLATELET
Basophils Absolute: 0.1 10*3/uL (ref 0.0–0.2)
Basos: 1 %
EOS (ABSOLUTE): 0.6 10*3/uL — ABNORMAL HIGH (ref 0.0–0.4)
Eos: 9 %
Hematocrit: 40.2 % (ref 37.5–51.0)
Hemoglobin: 13.6 g/dL (ref 13.0–17.7)
Immature Grans (Abs): 0 10*3/uL (ref 0.0–0.1)
Immature Granulocytes: 0 %
Lymphocytes Absolute: 1.4 10*3/uL (ref 0.7–3.1)
Lymphs: 21 %
MCH: 29.2 pg (ref 26.6–33.0)
MCHC: 33.8 g/dL (ref 31.5–35.7)
MCV: 87 fL (ref 79–97)
Monocytes Absolute: 0.7 10*3/uL (ref 0.1–0.9)
Monocytes: 10 %
Neutrophils Absolute: 3.7 10*3/uL (ref 1.4–7.0)
Neutrophils: 59 %
Platelets: 197 10*3/uL (ref 150–450)
RBC: 4.65 x10E6/uL (ref 4.14–5.80)
RDW: 13.1 % (ref 11.6–15.4)
WBC: 6.4 10*3/uL (ref 3.4–10.8)

## 2020-03-22 LAB — URIC ACID: Uric Acid: 5.4 mg/dL (ref 3.8–8.4)

## 2020-03-23 ENCOUNTER — Other Ambulatory Visit: Payer: Self-pay

## 2020-03-23 DIAGNOSIS — M67479 Ganglion, unspecified ankle and foot: Secondary | ICD-10-CM

## 2020-03-24 ENCOUNTER — Other Ambulatory Visit: Payer: Self-pay | Admitting: Family Medicine

## 2020-03-24 ENCOUNTER — Encounter: Payer: Self-pay | Admitting: Family Medicine

## 2020-03-24 ENCOUNTER — Telehealth: Payer: Self-pay

## 2020-03-24 MED ORDER — CEPHALEXIN 500 MG PO CAPS: 500.0000 mg | ORAL_CAPSULE | Freq: Two times a day (BID) | ORAL | 0 refills | Status: DC

## 2020-03-24 NOTE — Telephone Encounter (Signed)
[name] called to report that Bryan Zimmerman's toe looks worse.  Dr. Tobie Poet is going to call in an antibiotic and [name] is going to call tomorrow for the Dermatology appointment.

## 2020-03-28 DIAGNOSIS — D485 Neoplasm of uncertain behavior of skin: Secondary | ICD-10-CM | POA: Diagnosis not present

## 2020-03-28 DIAGNOSIS — M1A0711 Idiopathic chronic gout, right ankle and foot, with tophus (tophi): Secondary | ICD-10-CM | POA: Diagnosis not present

## 2020-04-04 ENCOUNTER — Ambulatory Visit: Payer: Self-pay | Admitting: Podiatry

## 2020-04-21 ENCOUNTER — Other Ambulatory Visit: Payer: Self-pay

## 2020-04-21 ENCOUNTER — Ambulatory Visit: Payer: No Typology Code available for payment source

## 2020-04-21 ENCOUNTER — Ambulatory Visit (INDEPENDENT_AMBULATORY_CARE_PROVIDER_SITE_OTHER): Payer: Medicare Other | Admitting: Family Medicine

## 2020-04-21 ENCOUNTER — Encounter: Payer: Self-pay | Admitting: Family Medicine

## 2020-04-21 VITALS — BP 136/78 | HR 58 | Temp 98.2°F | Ht 69.0 in | Wt 198.8 lb

## 2020-04-21 DIAGNOSIS — Z23 Encounter for immunization: Secondary | ICD-10-CM

## 2020-04-21 NOTE — Patient Instructions (Addendum)
TdaP and flu shot today

## 2020-04-21 NOTE — Progress Notes (Signed)
Subjective:   Kavon Valenza is a 74 y.o. male who presents for Medicare Annual/Subsequent preventive examination.  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  Advanced Directives 12/02/2019  Does Patient Have a Medical Advance Directive? Yes  Type of Paramedic of [place];Living will    Current Medications (verified) Outpatient Encounter Medications as of 04/21/2020  Medication Sig  . aspirin EC 81 MG tablet Take 81 mg by mouth daily. Swallow whole.  . Boswellia-Glucosamine-Vit D (OSTEO BI-FLEX ONE PER DAY PO) Take by mouth.  . carvedilol (COREG) 6.25 MG tablet Take 6.25 mg by mouth 2 (two) times daily.  . cetirizine (ZYRTEC) 10 MG tablet Take 10 mg by mouth daily.  Marland Kitchen co-enzyme Q-10 30 MG capsule Take 100 mg by mouth 3 (three) times daily.  . fenofibrate 160 MG tablet TAKE 1 TABLET DAILY.  Marland Kitchen lisinopril (ZESTRIL) 40 MG tablet Take 40 mg by mouth 2 (two) times daily.  . Misc Natural Products (PROSTATE SUPPORT) 300-15 MG TABS Take by mouth.  . nitroGLYCERIN (NITROSTAT) 0.4 MG SL tablet Place 0.4 mg under the tongue every 5 (five) minutes as needed for chest pain.  . pravastatin (PRAVACHOL) 40 MG tablet Take 1 tablet (40 mg total) by mouth daily.  . [DISCONTINUED] cephALEXin (KEFLEX) 500 MG capsule Take 1 capsule (500 mg total) by mouth 2 (two) times daily.  . [DISCONTINUED] triamcinolone cream (KENALOG) 0.1 % Apply 1 application topically 2 (two) times daily.   No facility-administered encounter medications on file as of 04/21/2020.   Allergies (verified)  History: Past Medical History:  Diagnosis Date  . CAD (coronary artery disease)   . History of heart attack   . History of kidney stones   . Hypertensive heart  disease without heart failure   . Mixed hyperlipidemia   . Osteoarthritis    Past Surgical History:  Procedure Laterality Date  . CATARACT EXTRACTION, BILATERAL    . HERNIA REPAIR    . 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 stints     Family History  Problem Relation Age of Onset  . Dementia Mother   . Cancer Father        pancreatic  . Hypertension Other   . Hyperlipidemia Other    Social History   Socioeconomic History  . Marital status: Married    Spouse name: Not on file  . Number of children: Not on file  . Years of education: Not on file  . Highest education level: Not on file  Occupational History  . Not on file  Tobacco Use  . Smoking status: Former Smoker    Types: Cigarettes  . Smokeless tobacco: Never Used  Substance and Sexual Activity  . Alcohol use: Never  . Drug use: Never  . Sexual activity: Not on file  Other Topics Concern  . Not on file  Social History Narrative  . Not on file   Social Determinants of Health   Financial Resource Strain:   . Difficulty of Paying Living Expenses: Not on file  Food Insecurity:   . Worried About Charity fundraiser in the Last Year: Not on file  . Ran Out of Food  in the Last Year: Not on file  Transportation Needs:   . Lack of Transportation (Medical): Not on file  . Lack of Transportation (Non-Medical): Not on file  Physical Activity:   . Days of Exercise per Week: Not on file  . Minutes of Exercise per Session: Not on file  Stress:   . Feeling of Stress : Not on file  Social Connections:   . Frequency of Communication with Friends and Family: Not on file  . Frequency of Social Gatherings with Friends and Family: Not on file  . Attends Religious Services: Not on file  . Active Member of Clubs or Organizations: Not on file  . Attends Archivist Meetings: Not on file  . Marital Status: Not on file   Tobacco Counseling-quit 40years ago  Activities of Daily Living  Patient Care Team: Rochel Brome, MD as PCP - General (Family Medicine) Park Liter, MD as Consulting Physician (Cardiology)   Dr. Trenda Moots hand Dr. Jefm Bryant Assessment:   This is a routine wellness examination for [place].  Hearing/Vision screen no concerns  Dietary issues and exercise activities discussed:   Depression Screen PHQ 2/9 Scores 03/21/2020 12/03/2019  PHQ - 2 Score 0 0    Fall Risk Fall Risk  04/21/2020 03/21/2020 12/13/2019 03/15/2017  Falls in the past year? 1 0 0 No  Comment - - - Emmi Telephone Survey: data to providers prior to load  Number falls in past yr: 0 0 0 -  Injury with Fall? 0 - 0 -  Follow up - - Falls evaluation completed;Falls prevention discussed -   Any stairs in or around the home?2 back door,  If so, are there any without handrails?yes Home free of loose throw rugs in walkways, pet beds, electrical cords, etc? hard wood Adequate lighting in your home to reduce risk of falls? yes  ASSISTIVE DEVICES UTILIZED TO PREVENT FALLS:  Life alert? no Use of a cane, walker or w/c?no Grab bars in the bathroom? no Shower chair or bench in shower? no Elevated toilet seat or a handicapped toilet? no   Cognitive Function:  Immunizations Immunization History  Administered Date(s) Administered  . Moderna SARS-COVID-2 Vaccination 10/02/2019  . Tdap 08/22/2010  pneumo x 2 -reviewed with pt Qualifies for Shingles Vaccine? completed at [place] Maintenance Due  Topic Date Due  . Hepatitis C Screening  Never done  . COLONOSCOPY  Never done  . PNA vac Low Risk Adult (1 of 2 - PCV13) Never done  . COVID-19 Vaccine (2 - Moderna 2-dose series) 10/30/2019  . INFLUENZA VACCINE  03/20/2020   Colonoscopy 2013-barium at [facility]  Additional Screening: Vision Screening: Recommended annual ophthalmology exams for early detection of glaucoma and other disorders of the eye. Is the patient up to date with their annual eye exam?   yes  in which the patient attends annual eye exams? yes If pt is not established with a provider, would they like to be referred to a provider to establish care? no  Dental Screening: Recommended annual dental exams for proper oral hygiene    Plan:    I have personally reviewed and noted the following in the patient's chart:   . Medical and social history . Use of alcohol, tobacco or illicit drugs  . Current medications and supplements . Functional ability and status . Nutritional status . Physical activity . Advanced directives . List of other physicians . Hospitalizations, surgeries, and ER visits in  previous 12 months . Vitals . Screenings to include cognitive, depression, and falls . Referrals and appointments  In addition, I have reviewed and discussed with patient certain preventive protocols, quality metrics, and best practice recommendations. A written personalized care plan for preventive services as well as general preventive health recommendations were provided to patient.  Mertha Baars, MD   04/21/2020

## 2020-05-17 ENCOUNTER — Other Ambulatory Visit: Payer: Self-pay | Admitting: Family Medicine

## 2020-05-27 ENCOUNTER — Other Ambulatory Visit: Payer: Self-pay | Admitting: Family Medicine

## 2020-06-03 ENCOUNTER — Other Ambulatory Visit: Payer: Self-pay

## 2020-06-03 ENCOUNTER — Other Ambulatory Visit: Payer: Medicare Other

## 2020-06-03 DIAGNOSIS — E782 Mixed hyperlipidemia: Secondary | ICD-10-CM

## 2020-06-03 DIAGNOSIS — I119 Hypertensive heart disease without heart failure: Secondary | ICD-10-CM

## 2020-06-04 LAB — COMPREHENSIVE METABOLIC PANEL
ALT: 30 IU/L (ref 0–44)
AST: 26 IU/L (ref 0–40)
Albumin/Globulin Ratio: 1.8 (ref 1.2–2.2)
Albumin: 4.2 g/dL (ref 3.7–4.7)
Alkaline Phosphatase: 42 IU/L — ABNORMAL LOW (ref 44–121)
BUN/Creatinine Ratio: 14 (ref 10–24)
BUN: 16 mg/dL (ref 8–27)
Bilirubin Total: 0.3 mg/dL (ref 0.0–1.2)
CO2: 21 mmol/L (ref 20–29)
Calcium: 9.5 mg/dL (ref 8.6–10.2)
Chloride: 106 mmol/L (ref 96–106)
Creatinine, Ser: 1.12 mg/dL (ref 0.76–1.27)
GFR calc Af Amer: 74 mL/min/{1.73_m2} (ref >59)
GFR calc non Af Amer: 64 mL/min/{1.73_m2} (ref >59)
Globulin, Total: 2.3 g/dL (ref 1.5–4.5)
Glucose: 99 mg/dL (ref 65–99)
Potassium: 4.3 mmol/L (ref 3.5–5.2)
Sodium: 140 mmol/L (ref 134–144)
Total Protein: 6.5 g/dL (ref 6.0–8.5)

## 2020-06-04 LAB — CBC
Hematocrit: 42 % (ref 37.5–51.0)
Hemoglobin: 14.1 g/dL (ref 13.0–17.7)
MCH: 29.8 pg (ref 26.6–33.0)
MCHC: 33.6 g/dL (ref 31.5–35.7)
MCV: 89 fL (ref 79–97)
Platelets: 196 10*3/uL (ref 150–450)
RBC: 4.73 x10E6/uL (ref 4.14–5.80)
RDW: 13.1 % (ref 11.6–15.4)
WBC: 6.3 10*3/uL (ref 3.4–10.8)

## 2020-06-04 LAB — LIPID PANEL
Chol/HDL Ratio: 3.8 ratio (ref 0.0–5.0)
Cholesterol, Total: 147 mg/dL (ref 100–199)
HDL: 39 mg/dL — ABNORMAL LOW (ref >39)
LDL Chol Calc (NIH): 91 mg/dL (ref 0–99)
Triglycerides: 90 mg/dL (ref 0–149)
VLDL Cholesterol Cal: 17 mg/dL (ref 5–40)

## 2020-06-04 LAB — CARDIOVASCULAR RISK ASSESSMENT

## 2020-06-04 LAB — TSH: TSH: 1.22 u[IU]/mL (ref 0.450–4.500)

## 2020-06-06 ENCOUNTER — Other Ambulatory Visit: Payer: Self-pay

## 2020-06-06 ENCOUNTER — Encounter: Payer: Self-pay | Admitting: Family Medicine

## 2020-06-06 ENCOUNTER — Ambulatory Visit (INDEPENDENT_AMBULATORY_CARE_PROVIDER_SITE_OTHER): Payer: Medicare Other | Admitting: Family Medicine

## 2020-06-06 VITALS — BP 122/78 | HR 77 | Temp 97.6°F | Ht 69.0 in | Wt 193.0 lb

## 2020-06-06 DIAGNOSIS — E663 Overweight: Secondary | ICD-10-CM | POA: Diagnosis not present

## 2020-06-06 DIAGNOSIS — I119 Hypertensive heart disease without heart failure: Secondary | ICD-10-CM

## 2020-06-06 DIAGNOSIS — Z6829 Body mass index (BMI) 29.0-29.9, adult: Secondary | ICD-10-CM

## 2020-06-06 DIAGNOSIS — E782 Mixed hyperlipidemia: Secondary | ICD-10-CM | POA: Diagnosis not present

## 2020-06-06 MED ORDER — FENOFIBRATE 160 MG PO TABS
160.0000 mg | ORAL_TABLET | Freq: Every day | ORAL | 3 refills | Status: DC
Start: 2020-06-06 — End: 2020-09-12

## 2020-06-06 NOTE — Progress Notes (Signed)
Established Patient Office Visit  Subjective:  Patient ID: Bryan Zimmerman, male    DOB: 12-09-1945  Age: 74 y.o. MRN: [id number]  CC:  Chief Complaint  Patient presents with  . Hypertension    56M Follow up    HPI Pt presents with hyperlipidemia.  Current treatment includes Pravachol and coenzyme q 10..  Compliance with treatment has been good; he takes his medication as directed, maintains his low cholesterol diet, and follows up as directed.  Exercises in the form of yard work.     For his hypertension, his current cardiac medication regimen includes carvedilol and lisinopril.  He is tolerating the medication well without side effects.  Compliance with treatment has been good; he takes his medication as directed and follows up as directed.      With regard to the atherosclerotic heart disease of native coronary artery without angina pectoris, he is here today for routine follow-up.  [name] has a prior history of a myocardial infarction and is currently on a beta blocker.  His heart disease was first diagnosed 5 years ago.  The course of the disease has been stable.  Currently, his treatment regimen consists of daily 81 mg aspirin, an ACEI ( lisinopril ),  Coreg, and Pravachol.  No associated symptoms are reported.  Current level of activity includes ability to walk and perform moderate exercise.  [name] is compliant with low fat diet and taking medications as recommended and prescribed.    Past Medical History:  Diagnosis Date  . CAD (coronary artery disease)   . History of heart attack   . History of kidney stones   . Hypertensive heart disease without heart failure   . Mixed hyperlipidemia   . Osteoarthritis     Family History  Problem Relation Age of Onset  . Dementia Mother   . Cancer Father        pancreatic  . Hypertension Other   . Hyperlipidemia Other     Social History   Socioeconomic History  . Marital status: Married    Spouse name: Not on file  . Number of  children: Not on file  . Years of education: Not on file  . Highest education level: Not on file  Occupational History  . Not on file  Tobacco Use  . Smoking status: Former Smoker    Types: Cigarettes  . Smokeless tobacco: Never Used  Substance and Sexual Activity  . Alcohol use: Never  . Drug use: Never  . Sexual activity: Not on file  Other Topics Concern  . Not on file  Social History Narrative  . Not on file   Social Determinants of Health   Financial Resource Strain:   . Difficulty of Paying Living Expenses: Not on file  Food Insecurity:   . Worried About Charity fundraiser in the Last Year: Not on file  . Ran Out of Food in the Last Year: Not on file  Transportation Needs:   . Lack of Transportation (Medical): Not on file  . Lack of Transportation (Non-Medical): Not on file  Physical Activity:   . Days of Exercise per Week: Not on file  . Minutes of Exercise per Session: Not on file  Stress:   . Feeling of Stress : Not on file  Social Connections:   . Frequency of Communication with Friends and Family: Not on file  . Frequency of Social Gatherings with Friends and Family: Not on file  . Attends Religious Services: Not on file  .  Active Member of Clubs or Organizations: Not on file  . Attends Archivist Meetings: Not on file  . Marital Status: Not on file  Intimate Partner Violence:   . Fear of Current or Ex-Partner: Not on file  . Emotionally Abused: Not on file  . Physically Abused: Not on file  . Sexually Abused: Not on file    Outpatient Medications Prior to Visit  Medication Sig Dispense Refill  . aspirin EC 81 MG tablet Take 81 mg by mouth daily. Swallow whole.    . Boswellia-Glucosamine-Vit D (OSTEO BI-FLEX ONE PER DAY PO) Take by mouth.    . carvedilol (COREG) 6.25 MG tablet TAKE 1 TABLET TWICE A DAY 180 tablet 3  . cetirizine (ZYRTEC) 10 MG tablet Take 10 mg by mouth daily.    Marland Kitchen co-enzyme Q-10 30 MG capsule Take 100 mg by mouth 3 (three)  times daily.    Marland Kitchen lisinopril (ZESTRIL) 40 MG tablet TAKE 1 TABLET TWICE A DAY 180 tablet 3  . Misc Natural Products (PROSTATE SUPPORT) 300-15 MG TABS Take by mouth.    . nitroGLYCERIN (NITROSTAT) 0.4 MG SL tablet Place 0.4 mg under the tongue every 5 (five) minutes as needed for chest pain.    . pravastatin (PRAVACHOL) 40 MG tablet TAKE 1 TABLET DAILY WITH   THE EVENING MEAL 90 tablet 3  . fenofibrate 160 MG tablet TAKE 1 TABLET DAILY. 90 tablet 3   No facility-administered medications prior to visit.    No Known Allergies  ROS Review of Systems  Constitutional: Negative for chills, diaphoresis, fatigue and fever.  HENT: Negative for congestion, ear pain and sore throat.   Respiratory: Negative for cough and shortness of breath.   Cardiovascular: Negative for chest pain and leg swelling.  Gastrointestinal: Negative for abdominal pain, constipation, diarrhea, nausea and vomiting.  Genitourinary: Negative for dysuria and urgency.  Musculoskeletal: Negative for arthralgias (Osteoarthritis of knees bilaterally.  Tolerates.) and myalgias.  Neurological: Negative for dizziness and headaches.  Psychiatric/Behavioral: Negative for dysphoric mood.      Objective:    Physical Exam Constitutional:      Appearance: He is well-developed.  Cardiovascular:     Rate and Rhythm: Normal rate and regular rhythm.     Heart sounds: Normal heart sounds. No murmur heard.      Comments: No carotid bruits Pulmonary:     Effort: Pulmonary effort is normal. No respiratory distress.     Breath sounds: Normal breath sounds.  Abdominal:     General: Bowel sounds are normal.     Palpations: Abdomen is soft.     Tenderness: There is no abdominal tenderness.  Neurological:     Mental Status: He is alert and oriented to person, place, and time.  Psychiatric:        Behavior: Behavior normal.    BP 122/78 (BP Location: Right Arm, Patient Position: Sitting, Cuff Size: Normal)   Pulse 77   Temp 97.6 F  (36.4 C) (Temporal)   Ht 5\' 9"  (1.753 m)   Wt 193 lb (87.5 kg)   SpO2 99%   BMI 28.50 kg/m  Wt Readings from Last 3 Encounters:  06/06/20 193 lb (87.5 kg)  04/21/20 198 lb 12.8 oz (90.2 kg)  03/21/20 198 lb 12.8 oz (90.2 kg)     Health Maintenance Due  Topic Date Due  . Hepatitis C Screening  Never done    There are no preventive care reminders to display for this patient.  Lab  Results  Component Value Date   TSH 1.220 06/03/2020   Lab Results  Component Value Date   WBC 6.3 06/03/2020   HGB 14.1 06/03/2020   HCT 42.0 06/03/2020   MCV 89 06/03/2020   PLT 196 06/03/2020   Lab Results  Component Value Date   NA 140 06/03/2020   K 4.3 06/03/2020   CO2 21 06/03/2020   GLUCOSE 99 06/03/2020   BUN 16 06/03/2020   CREATININE 1.12 06/03/2020   BILITOT 0.3 06/03/2020   ALKPHOS 42 (L) 06/03/2020   AST 26 06/03/2020   ALT 30 06/03/2020   PROT 6.5 06/03/2020   ALBUMIN 4.2 06/03/2020   CALCIUM 9.5 06/03/2020   Lab Results  Component Value Date   CHOL 147 06/03/2020   Lab Results  Component Value Date   HDL 39 (L) 06/03/2020   Lab Results  Component Value Date   LDLCALC 91 06/03/2020   Lab Results  Component Value Date   TRIG 90 06/03/2020   Lab Results  Component Value Date   CHOLHDL 3.8 06/03/2020   No results found for: HGBA1C    Assessment & Plan:   1. Mixed hyperlipidemia Well controlled.  No changes to medicines.  Continue to work on eating a healthy diet and exercise.  Labs reviewed today.   2. Hypertensive heart disease without heart failure Well controlled.  No changes to medicines.  Continue to work on eating a healthy diet and exercise.  Labs reviewed today.   3. Overweight with bmi 29.  Recommend eat healthy and exercise  Follow-up: Return in about 6 months (around 12/05/2020) for fasting.    Rochel Brome, MD

## 2020-06-12 ENCOUNTER — Encounter: Payer: Self-pay | Admitting: Family Medicine

## 2020-07-01 DIAGNOSIS — Z23 Encounter for immunization: Secondary | ICD-10-CM | POA: Diagnosis not present

## 2020-07-12 ENCOUNTER — Other Ambulatory Visit: Payer: Self-pay

## 2020-07-12 DIAGNOSIS — Z79899 Other long term (current) drug therapy: Secondary | ICD-10-CM

## 2020-07-13 ENCOUNTER — Telehealth: Payer: Self-pay | Admitting: Family Medicine

## 2020-07-13 NOTE — Progress Notes (Signed)
  Chronic Care Management   Outreach Note  07/13/2020 Name: Bryan Zimmerman MRN: [id number] DOB: 08-Dec-1945  Referred by: Rochel Brome, MD Reason for referral : Chronic Care Management   An unsuccessful telephone outreach was attempted today. The patient was referred to the pharmacist for assistance with care management and care coordination.   Follow Up Plan:  Bryan Zimmerman  Upstream Scheduler

## 2020-07-18 ENCOUNTER — Telehealth: Payer: Self-pay | Admitting: Family Medicine

## 2020-07-18 NOTE — Chronic Care Management (AMB) (Signed)
  Chronic Care Management   Note  07/18/2020 Name: Safal Halderman MRN: [id number] DOB: 1946-05-08  Furqan Gosselin is a 74 y.o. year old male who is a primary care patient of Cox, Kirsten, MD. I reached out to [employer] by phone today in response to a referral sent by Mr. Lowry Bowl PCP, Cox, Kirsten, MD.   Mr. Mahler was given information about Chronic Care Management services today including:  1. CCM service includes personalized support from designated clinical staff supervised by his physician, including individualized plan of care and coordination with other care providers 2. 24/7 contact phone numbers for assistance for urgent and routine care needs. 3. Service will only be billed when office clinical staff spend 20 minutes or more in a month to coordinate care. 4. Only one practitioner may furnish and bill the service in a calendar month. 5. The patient may stop CCM services at any time (effective at the end of the month) by phone call to the office staff.   [name] verbally agreed to assistance and services provided by embedded care coordination/care management team today.  Follow up plan:   [place]

## 2020-09-09 ENCOUNTER — Telehealth: Payer: Self-pay

## 2020-09-09 NOTE — Chronic Care Management (AMB) (Signed)
° ° °  Chronic Care Management Pharmacy Assistant   Name: Bryan Zimmerman  MRN: [id number] DOB: 07/01/1946  Reason for Encounter: Initial questions for appointment with Bryan Zimmerman, CPP  PCP : Bryan Brome, MD  Allergies:  No Known Allergies  Medications: Outpatient Encounter Medications as of 09/09/2020  Medication Sig   aspirin EC 81 MG tablet Take 81 mg by mouth daily. Swallow whole.   Boswellia-Glucosamine-Vit D (OSTEO BI-FLEX ONE PER DAY PO) Take by mouth.   carvedilol (COREG) 6.25 MG tablet TAKE 1 TABLET TWICE A DAY   cetirizine (ZYRTEC) 10 MG tablet Take 10 mg by mouth daily.   co-enzyme Q-10 30 MG capsule Take 100 mg by mouth 3 (three) times daily.   fenofibrate 160 MG tablet Take 1 tablet (160 mg total) by mouth daily.   lisinopril (ZESTRIL) 40 MG tablet TAKE 1 TABLET TWICE A DAY   Misc Natural Products (PROSTATE SUPPORT) 300-15 MG TABS Take by mouth.   nitroGLYCERIN (NITROSTAT) 0.4 MG SL tablet Place 0.4 mg under the tongue every 5 (five) minutes as needed for chest pain.   pravastatin (PRAVACHOL) 40 MG tablet TAKE 1 TABLET DAILY WITH   THE EVENING MEAL   No facility-administered encounter medications on file as of 09/09/2020.    Current Diagnosis: Patient Active Problem List   Diagnosis Date Noted   Mixed hyperlipidemia 12/13/2019   Hypertensive heart disease without heart failure 12/13/2019   Overweight with body mass index (BMI) of 29 to 29.9 in adult 12/13/2019   Have you seen any other providers since your last visit? No, patient has now seen anyone.  Any changes in your medications or health?  Patient has not ha any changes to medication or health.  Any side effects from any medications? Patient reports no side effects from medication.  Do you have an symptoms or problems not managed by your medications? Patient has no unmanaged issues.  Any concerns about your health right now? Patient has no concerns at this time.  Has your provider asked that you  check blood pressure, blood sugar, or follow special diet at home? Per patient wife, Bryan Zimmerman, she has checked it from time to time but stated she probably needs to do it on a regular basis.  Do you get any type of exercise on a regular basis? Yes, patient is very active and stays on the go.  Can you think of a goal you would like to reach for your health? Patient stated to just continue as well as he is doing.  Do you have any problems getting your medications? Patient wife reports they just switched to [place] has contacted the office for a refill for Fenofibrate.   Is there anything that you would like to discuss during the appointment? Patient could not think of anything at this time.  Patient knows this appointment is now over the phone and to have medications near by.  Follow-Up:  Pharmacist Review  Bryan Zimmerman, CPP notified  Bryan Zimmerman, [place] Pharmacist Assistant [phone]

## 2020-09-12 ENCOUNTER — Other Ambulatory Visit: Payer: Self-pay

## 2020-09-12 MED ORDER — FENOFIBRATE 160 MG PO TABS
160.0000 mg | ORAL_TABLET | Freq: Every day | ORAL | 3 refills | Status: DC
Start: 2020-09-12 — End: 2020-09-14

## 2020-09-14 ENCOUNTER — Other Ambulatory Visit: Payer: Self-pay

## 2020-09-14 ENCOUNTER — Ambulatory Visit: Payer: Medicare Other

## 2020-09-14 DIAGNOSIS — I119 Hypertensive heart disease without heart failure: Secondary | ICD-10-CM

## 2020-09-14 DIAGNOSIS — E782 Mixed hyperlipidemia: Secondary | ICD-10-CM

## 2020-09-14 MED ORDER — FENOFIBRATE 160 MG PO TABS: 160.0000 mg | ORAL_TABLET | Freq: Every day | ORAL | 3 refills | Status: DC

## 2020-09-14 NOTE — Patient Instructions (Addendum)
Visit Information  Thank you for your time discussing your medications. I look forward to working with you to achieve your health care goals. Below is a summary of what we talked about during our visit.   Goals Addressed            This Visit's Progress   . pharmacy care plan       CARE PLAN ENTRY (see longitudinal plan of care for additional care plan information)  Current Barriers:  . Chronic Disease Management support, education, and care coordination needs related to Hypertension and Hyperlipidemia   Hypertension BP Readings from Last 3 Encounters:  06/06/20 122/78  04/21/20 136/78  03/21/20 (!) 154/64   . Pharmacist Clinical Goal(s): o Over the next 90 days, patient will work with PharmD and providers to maintain BP goal <130/80 . Current regimen:   Carvedilol 6.25 mg bid   Lisinopril 40 mg bid  . Interventions: o Encouraged healthy diet of fruits, vegetables and lean protein. o Discussed continuing to staying active in yard and with home projects.  o Reviewed home blood pressure readings and medication regimen.  . Patient self care activities - Over the next 90 days, patient will: o Continue taking medications as prescribed.  o Ensure daily salt intake < 2300 mg/day  Hyperlipidemia Lab Results  Component Value Date/Time   LDLCALC 91 06/03/2020 11:07 AM   . Pharmacist Clinical Goal(s): o Over the next 90 days, patient will work with PharmD and providers to achieve LDL goal < 70 . Current regimen:  . Aspirin ec 81 mg daily  . Fenofibrate 160 mg daily  . Pravastatin 40 mg daily evening meal  . Nitroglycerin 0.4 mg prn chest pain  . Co-enzyme q-10 30 mg tid  . Interventions: o Discussed importance of low-fat diet and exercise.  o Coordinated refill of fenofibrate sent to [facility].  o Reviewed lipid panel and discussed previous intolerance to atorvastatin.  . Patient self care activities - Over the next 90 days, patient will: o Continue current medication.     Medication management . Pharmacist Clinical Goal(s): o Over the next 90 days, patient will work with PharmD and providers to maintain optimal medication adherence . Current pharmacy[facility]  . Interventions o Comprehensive medication review performed. o Continue current medication management strategy . Patient self care activities - Over the next 90 days, patient will: o Focus on medication adherence by using pill box  o Take medications as prescribed o Report any questions or concerns to PharmD and/or provider(s)  Initial goal documentation        Bryan Zimmerman was given information about Chronic Care Management services today including:  1. CCM service includes personalized support from designated clinical staff supervised by his physician, including individualized plan of care and coordination with other care providers 2. 24/7 contact phone numbers for assistance for urgent and routine care needs. 3. Standard insurance, coinsurance, copays and deductibles apply for chronic care management only during months in which we provide at least 20 minutes of these services. Most insurances cover these services at 100%, however patients may be responsible for any copay, coinsurance and/or deductible if applicable. This service may help you avoid the need for more expensive face-to-face services. 4. Only one practitioner may furnish and bill the service in a calendar month. 5. The patient may stop CCM services at any time (effective at the end of the month) by phone call to the office staff.  Patient agreed to services and verbal consent  obtained.   Patient verbalizes understanding of instructions provided today and agrees to view in [place].  Telephone follow up appointment with pharmacy team member scheduled for: 12/2020  Sherre Poot, PharmD Clinical Pharmacist [facility] [phone] (office) [phone] (mobile) Fat and Cholesterol Restricted Eating Plan Getting  too much fat and cholesterol in your diet may cause health problems. Choosing the right foods helps keep your fat and cholesterol at normal levels. This can keep you from getting certain diseases. Your doctor may recommend an eating plan that includes:  Total fat: ______% or less of total calories a day.  Saturated fat: ______% or less of total calories a day.  Cholesterol: less than _________mg a day.  Fiber: ______g a day. What are tips for following this plan? Meal planning  At meals, divide your plate into four equal parts: ? Fill one-half of your plate with vegetables and green salads. ? Fill one-fourth of your plate with whole grains. ? Fill one-fourth of your plate with low-fat (lean) protein foods.  Eat fish that is high in omega-3 fats at least two times a week. This includes mackerel, tuna, sardines, and salmon.  Eat foods that are high in fiber, such as whole grains, beans, apples, broccoli, carrots, peas, and barley. General tips  Work with your doctor to lose weight if you need to.  Avoid: ? Foods with added sugar. ? Fried foods. ? Foods with partially hydrogenated oils.  Limit alcohol intake to no more than 1 drink a day for nonpregnant women and 2 drinks a day for men. One drink equals 12 oz of beer, 5 oz of wine, or 1 oz of hard liquor.   Reading food labels  Check food labels for: ? Trans fats. ? Partially hydrogenated oils. ? Saturated fat (g) in each serving. ? Cholesterol (mg) in each serving. ? Fiber (g) in each serving.  Choose foods with healthy fats, such as: ? Monounsaturated fats. ? Polyunsaturated fats. ? Omega-3 fats.  Choose grain products that have whole grains. Look for the word "whole" as the first word in the ingredient list. Cooking  Cook foods using low-fat methods. These include baking, boiling, grilling, and broiling.  Eat more home-cooked foods. Eat at restaurants and buffets less often.  Avoid cooking using saturated fats,  such as butter, cream, palm oil, palm kernel oil, and coconut oil. Recommended foods Fruits  All fresh, canned (in natural juice), or frozen fruits. Vegetables  Fresh or frozen vegetables (raw, steamed, roasted, or grilled). Green salads. Grains  Whole grains, such as whole wheat or whole grain breads, crackers, cereals, and pasta. Unsweetened oatmeal, bulgur, barley, quinoa, or [place] rice. Corn or whole wheat flour tortillas. Meats and other protein foods  Ground beef (85% or leaner), grass-fed beef, or beef trimmed of fat. Skinless chicken or [place]. Ground chicken or [place]. Pork trimmed of fat. All fish and seafood. Egg whites. Dried beans, peas, or lentils. Unsalted nuts or seeds. Unsalted canned beans. Nut butters without added sugar or oil. Dairy  Low-fat or nonfat dairy products, such as skim or 1% milk, 2% or reduced-fat cheeses, low-fat and fat-free ricotta or cottage cheese, or plain low-fat and nonfat yogurt. Fats and oils  Tub margarine without trans fats. Light or reduced-fat mayonnaise and salad dressings. Avocado. Olive, canola, sesame, or safflower oils. The items listed above may not be a complete list of foods and beverages you can eat. Contact a dietitian for more information.   Foods to avoid [employer]  fruit in heavy syrup. Fruit in cream or butter sauce. Fried fruit. Vegetables  Vegetables cooked in cheese, cream, or butter sauce. Fried vegetables. Grains  White bread. White pasta. White rice. Cornbread. Bagels, pastries, and croissants. Crackers and snack foods that contain trans fat and hydrogenated oils. Meats and other protein foods  Fatty cuts of meat. Ribs, chicken wings, bacon, sausage, bologna, salami, chitterlings, fatback, hot dogs, bratwurst, and packaged lunch meats. Liver and organ meats. Whole eggs and egg yolks. Chicken and [place] with skin. Fried meat. Dairy  Whole or 2% milk, cream, half-and-half, and cream cheese. Whole milk cheeses.  Whole-fat or sweetened yogurt. Full-fat cheeses. Nondairy creamers and whipped toppings. Processed cheese, cheese spreads, and cheese curds. Beverages  Alcohol. Sugar-sweetened drinks such as sodas, lemonade, and fruit drinks. Fats and oils  Butter, stick margarine, lard, shortening, ghee, or bacon fat. Coconut, palm kernel, and palm oils. Sweets and desserts  Corn syrup, sugars, honey, and molasses. Candy. Jam and jelly. Syrup. Sweetened cereals. Cookies, pies, cakes, donuts, muffins, and ice cream. The items listed above may not be a complete list of foods and beverages you should avoid. Contact a dietitian for more information. Summary  Choosing the right foods helps keep your fat and cholesterol at normal levels. This can keep you from getting certain diseases.  At meals, fill one-half of your plate with vegetables and green salads.  Eat high-fiber foods, like whole grains, beans, apples, carrots, peas, and barley.  Limit added sugar, saturated fats, alcohol, and fried foods. This information is not intended to replace advice given to you by your health care provider. Make sure you discuss any questions you have with your health care provider. Document Revised: 12/09/2019 Document Reviewed: 12/09/2019 Elsevier Patient Education  2021 [employer].

## 2020-09-14 NOTE — Chronic Care Management (AMB) (Signed)
Chronic Care Management Pharmacy  Name: Bryan Zimmerman  MRN: [id number] DOB: [birth year]  Chief Complaint/ HPI  Bryan Zimmerman,  75 y.o. , male presents for their Initial CCM visit with the clinical pharmacist via telephone due to COVID-19 Pandemic.  Plan Recommendations:   Reviewed patient's CAD LDL goal. Patient has tried/failed Lipitor in the past due to joint pain. Discussed importance of healthy diet and exercise. Will review next lipid panel and could consider Zetia in the future if needed for LDL.    PCP : Rochel Brome, MD  Their chronic conditions include: hypertensive heart disease without heart failure, mixed hyperlipidemia, CAD, osteoarthritis.   Office Visits: 06/06/2020 - continue medications. Work on [employer] and exercise.  04/21/2020 - AWV. TDAP and Flu shot given in office.  03/21/2020 - pain of toe or right foot. Patient's wife wisshes to call [facility] dermatology. Keflex for 7 days. Dermatitis due to sunburn. Kenalog cream.  Consult Visit: no notes available  Medications: Outpatient Encounter Medications as of 09/14/2020  Medication Sig  . aspirin EC 81 MG tablet Take 81 mg by mouth daily. Swallow whole.  . Boswellia-Glucosamine-Vit D (OSTEO BI-FLEX ONE PER DAY PO) Take 1 tablet by mouth daily.  . carvedilol (COREG) 6.25 MG tablet TAKE 1 TABLET TWICE A DAY  . cetirizine (ZYRTEC) 10 MG tablet Take 10 mg by mouth daily.  Marland Kitchen co-enzyme Q-10 30 MG capsule Take 100 mg by mouth 3 (three) times daily.  Marland Kitchen lisinopril (ZESTRIL) 40 MG tablet TAKE 1 TABLET TWICE A DAY  . Misc Natural Products (PROSTATE SUPPORT) 300-15 MG TABS Take 1 tablet by mouth daily.  . nitroGLYCERIN (NITROSTAT) 0.4 MG SL tablet Place 0.4 mg under the tongue every 5 (five) minutes as needed for chest pain.  . pravastatin (PRAVACHOL) 40 MG tablet TAKE 1 TABLET DAILY WITH   THE EVENING MEAL  . [DISCONTINUED] fenofibrate 160 MG tablet Take 1 tablet (160 mg total) by mouth daily.   No  facility-administered encounter medications on file as of 09/14/2020.   No Known Allergies  SDOH Screenings   Alcohol Screen: Not on file  Depression (PHQ2-9): Low Risk   . PHQ-2 Score: 0  Financial Resource Strain: Not on file  Food Insecurity: No Food Insecurity  . Worried About Charity fundraiser in the Last Year: Never true  . Ran Out of Food in the Last Year: Never true  Housing: Low Risk   . Last Housing Risk Score: 0  Physical Activity: Not on file  Social Connections: Not on file  Stress: Not on file  Tobacco Use: Medium Risk  . Smoking Tobacco Use: Former Smoker  . Smokeless Tobacco Use: Never Used  Transportation Needs: No Transportation Needs  . Lack of Transportation (Medical): No  . Lack of Transportation (Non-Medical): No     Current Diagnosis/Assessment:  Goals Addressed            This Visit's Progress   . pharmacy care plan       CARE PLAN ENTRY (see longitudinal plan of care for additional care plan information)  Current Barriers:  . Chronic Disease Management support, education, and care coordination needs related to Hypertension and Hyperlipidemia   Hypertension BP Readings from Last 3 Encounters:  06/06/20 122/78  04/21/20 136/78  03/21/20 (!) 154/64   . Pharmacist Clinical Goal(s): o Over the next 90 days, patient will work with PharmD and providers to maintain BP goal <130/80 . Current regimen:   Carvedilol 6.25 mg bid  Lisinopril 40 mg bid  . Interventions: o Encouraged healthy diet of fruits, vegetables and lean protein. o Discussed continuing to staying active in yard and with home projects.  o Reviewed home blood pressure readings and medication regimen.  . Patient self care activities - Over the next 90 days, patient will: o Continue taking medications as prescribed.  o Ensure daily salt intake < 2300 mg/day  Hyperlipidemia Lab Results  Component Value Date/Time   LDLCALC 91 06/03/2020 11:07 AM   . Pharmacist Clinical  Goal(s): o Over the next 90 days, patient will work with PharmD and providers to achieve LDL goal < 70 . Current regimen:  . Aspirin ec 81 mg daily  . Fenofibrate 160 mg daily  . Pravastatin 40 mg daily evening meal  . Nitroglycerin 0.4 mg prn chest pain  . Co-enzyme q-10 30 mg tid  . Interventions: o Discussed importance of low-fat diet and exercise.  o Coordinated refill of fenofibrate sent to [facility].  o Reviewed lipid panel and discussed previous intolerance to atorvastatin.  . Patient self care activities - Over the next 90 days, patient will: o Continue current medication.    Medication management . Pharmacist Clinical Goal(s): o Over the next 90 days, patient will work with PharmD and providers to maintain optimal medication adherence . Current pharmacy[facility]  . Interventions o Comprehensive medication review performed. o Continue current medication management strategy . Patient self care activities - Over the next 90 days, patient will: o Focus on medication adherence by using pill box  o Take medications as prescribed o Report any questions or concerns to PharmD and/or provider(s)  Initial goal documentation        Hypertension   BP today is:  <140/90  Office blood pressures are  BP Readings from Last 3 Encounters:  06/06/20 122/78  04/21/20 136/78  03/21/20 (!) 154/64    Patient has failed these meds in the past: none reported  Patient is currently controlled on the following medications:   Carvedilol 6.25 mg bid   Lisinopril 40 mg bid   Patient checks BP at home infrequently  Patient home BP readings are ranging: 129/67 mmHg  We discussed diet and exercise extensively. Reviewed home blood pressure readings. Discussed healthy diet and remaining active. Wife states she tries to cook as healthy as she can. States that it is hard to eat healthy. Patient stays busy with household projects and chores.   Plan  Continue current medications    Hyperlipidemia/CAD   LDL goal < 70  Last lipids Lab Results  Component Value Date   CHOL 147 06/03/2020   HDL 39 (L) 06/03/2020   LDLCALC 91 06/03/2020   TRIG 90 06/03/2020   CHOLHDL 3.8 06/03/2020   Hepatic Function Latest Ref Rng & Units 06/03/2020 11/27/2019 08/22/2010  Total Protein 6.0 - 8.5 g/dL 6.5 6.6 5.9(L)  Albumin 3.7 - 4.7 g/dL 4.2 4.3 3.3(L)  AST 0 - 40 IU/L 26 25 22   ALT 0 - 44 IU/L 30 26 27   Alk Phosphatase 44 - 121 IU/L 42(L) 75 60  Total Bilirubin 0.0 - 1.2 mg/dL 0.3 0.5 0.3     The 10-year ASCVD risk score Mikey Bussing DC Jr., et al., 2013) is: 27.6%   Values used to calculate the score:     Age: 24 years     Sex: Male     Is Non-Hispanic African American: No     Diabetic: No     Tobacco smoker: No  Systolic Blood Pressure: 528 mmHg     Is BP treated: Yes     HDL Cholesterol: 39 mg/dL     Total Cholesterol: 147 mg/dL   Patient has failed these meds in past: lipitor (joint pain) Patient is currently controlled on the following medications:  . Aspirin ec 81 mg daily  . Fenofibrate 160 mg daily  . Pravastatin 40 mg daily evening meal  . Nitroglycerin 0.4 mg prn chest pain  . Co-enzyme q-10 30 mg tid   We discussed:  diet and exercise extensively.   Recommended patient consider trying Crestor to achieve goal LDL with CAD/hx of heart attack. Wife states that cardiology had pushed for lower goal LDL in the past and patient was unable to tolerate Lipitor due to joint pain. Patient's wife reports that fenofibrate has helped patient's triglycerides well. They are out of medication and need prescription sent to [facility] for delivery. Patient's plan last year charged a high copay for fenofibrate. Patient changed to [facility] this year and plan seems to cover fenofibrate better. Patient had not needed nitroglycerin since heart attack. No chest pain.   Patient's wife states that patient tries his best with diet and stays really active. Encouraged low-fat diet  avoiding fried/fatty foods for improved cholesterol. Patient's wife states that knee pain limits activity.   Plan  Continue current medications  Health Maintenance   Patient is currently controlled on the following medications:  . Osteo Bi-flex bone support daily  . Prostate support   We discussed:  Patient purchases supplement OTC at [employer].   Plan  Continue current medications  Vaccines    Reviewed and discussed patient's vaccination history.  Patient up to date on Flu shot and Covid shot. Is up to date   Immunization History  Administered Date(s) Administered  . Fluad Quad(high Dose 65+) 04/21/2020  . Moderna Sars-Covid-2 Vaccination 10/02/2019, 10/30/2019, 07/01/2020  . Pneumococcal Conjugate-13 06/25/2014  . Pneumococcal Polysaccharide-23 06/09/2013  . Tdap 08/22/2010, 04/21/2020  . Zoster 08/20/2012    Plan  Patient up to date on vaccinations.   Medication Management   Patient's preferred pharmacy is:  [facility], [place] [address] Phone: [phone] Fax: [facility] [place], [place] [address] Phone: [phone] Fax: [phone]  Uses pill box? Yes Pt endorses great compliance  We discussed: Current pharmacy is preferred with insurance plan and patient is satisfied with pharmacy services  Plan  Continue current medication management strategy    Follow up: 6 month phone visit

## 2020-12-01 ENCOUNTER — Other Ambulatory Visit: Payer: No Typology Code available for payment source

## 2020-12-05 ENCOUNTER — Ambulatory Visit: Payer: No Typology Code available for payment source | Admitting: Family Medicine

## 2020-12-12 ENCOUNTER — Other Ambulatory Visit: Payer: Self-pay

## 2020-12-12 MED ORDER — FENOFIBRATE 160 MG PO TABS: 160.0000 mg | ORAL_TABLET | Freq: Every day | ORAL | 3 refills | Status: DC

## 2020-12-12 MED ORDER — CARVEDILOL 6.25 MG PO TABS: 6.2500 mg | ORAL_TABLET | Freq: Two times a day (BID) | ORAL | 3 refills | Status: DC

## 2020-12-12 MED ORDER — LISINOPRIL 40 MG PO TABS: 40.0000 mg | ORAL_TABLET | Freq: Two times a day (BID) | ORAL | 3 refills | Status: DC

## 2020-12-19 NOTE — Progress Notes (Signed)
Chronic Care Management Pharmacy Note  12/21/2020 Name:  Bryan Zimmerman MRN:  [id number] DOB:  [birth year]   Plan Updates:   Patient's wife reports that he has been having more leg pain which they feel could be coming from cholesterol medications. Counseled on the importance of good cholesterol control. Patient would like to review updated lipid panel this month prior to making any adjustments. Patient has tried and failed atorvastatin in the past which had more significant leg pain.   Subjective: Bryan Zimmerman is an 75 y.o. year old male who is a primary patient of Cox, Kirsten, MD.  The CCM team was consulted for assistance with disease management and care coordination needs.    Engaged with patient by telephone for follow up visit in response to provider referral for pharmacy case management and/or care coordination services.   Consent to Services:  The patient was given the following information about Chronic Care Management services today, agreed to services, and gave verbal consent: 1. CCM service includes personalized support from designated clinical staff supervised by the primary care provider, including individualized plan of care and coordination with other care providers 2. 24/7 contact phone numbers for assistance for urgent and routine care needs. 3. Service will only be billed when office clinical staff spend 20 minutes or more in a month to coordinate care. 4. Only one practitioner may furnish and bill the service in a calendar month. 5.The patient may stop CCM services at any time (effective at the end of the month) by phone call to the office staff. 6. The patient will be responsible for cost sharing (co-pay) of up to 20% of the service fee (after annual deductible is met). Patient agreed to services and consent obtained.  Patient Care Team: Rochel Brome, MD as PCP - General (Family Medicine) Park Liter, MD as Consulting Physician (Cardiology) Burnice Logan, [facility] as  Pharmacist (Pharmacist)  Recent office visits: None since last visit Recent consult visits: None since last visit  Hospital visits: None in previous 6 months  Objective:  Lab Results  Component Value Date   CREATININE 1.12 06/03/2020   BUN 16 06/03/2020   GFRNONAA 64 06/03/2020   GFRAA 74 06/03/2020   NA 140 06/03/2020   K 4.3 06/03/2020   CALCIUM 9.5 06/03/2020   CO2 21 06/03/2020   GLUCOSE 99 06/03/2020    No results found for: HGBA1C, FRUCTOSAMINE, GFR, MICROALBUR  Last diabetic Eye exam: No results found for: HMDIABEYEEXA  Last diabetic Foot exam: No results found for: HMDIABFOOTEX   Lab Results  Component Value Date   CHOL 147 06/03/2020   HDL 39 (L) 06/03/2020   LDLCALC 91 06/03/2020   TRIG 90 06/03/2020   CHOLHDL 3.8 06/03/2020    Hepatic Function Latest Ref Rng & Units 06/03/2020 11/27/2019 08/22/2010  Total Protein 6.0 - 8.5 g/dL 6.5 6.6 5.9(L)  Albumin 3.7 - 4.7 g/dL 4.2 4.3 3.3(L)  AST 0 - 40 IU/L 26 25 22   ALT 0 - 44 IU/L 30 26 27   Alk Phosphatase 44 - 121 IU/L 42(L) 75 60  Total Bilirubin 0.0 - 1.2 mg/dL 0.3 0.5 0.3    Lab Results  Component Value Date/Time   TSH 1.220 06/03/2020 11:07 AM    CBC Latest Ref Rng & Units 06/03/2020 03/21/2020 11/27/2019  WBC 3.4 - 10.8 x10E3/uL 6.3 6.4 7.6  Hemoglobin 13.0 - 17.7 g/dL 14.1 13.6 15.1  Hematocrit 37.5 - 51.0 % 42.0 40.2 44.8  Platelets 150 - 450 x10E3/uL 196  197 182    No results found for: VD25OH  Clinical ASCVD: Yes  The 10-year ASCVD risk score Mikey Bussing DC Jr., et al., 2013) is: 27.6%   Values used to calculate the score:     Age: 71 years     Sex: Male     Is Non-Hispanic African American: No     Diabetic: No     Tobacco smoker: No     Systolic Blood Pressure: 563 mmHg     Is BP treated: Yes     HDL Cholesterol: 39 mg/dL     Total Cholesterol: 147 mg/dL    Depression screen [facility] 2/9 09/14/2020 03/21/2020 12/03/2019  Decreased Interest 0 0 0  Down, Depressed, Hopeless 0 0 0  PHQ - 2 Score 0 0 0      Social History   Tobacco Use  Smoking Status Former Smoker  . Types: Cigarettes  Smokeless Tobacco Never Used   BP Readings from Last 3 Encounters:  06/06/20 122/78  04/21/20 136/78  03/21/20 (!) 154/64   Pulse Readings from Last 3 Encounters:  06/06/20 77  04/21/20 (!) 58  03/21/20 74   Wt Readings from Last 3 Encounters:  06/06/20 193 lb (87.5 kg)  04/21/20 198 lb 12.8 oz (90.2 kg)  03/21/20 198 lb 12.8 oz (90.2 kg)   BMI Readings from Last 3 Encounters:  06/06/20 28.50 kg/m  04/21/20 29.36 kg/m  03/21/20 29.36 kg/m    Assessment/Interventions: Review of patient past medical history, allergies, medications, health status, including review of consultants reports, laboratory and other test data, was performed as part of comprehensive evaluation and provision of chronic care management services.   SDOH:  (Social Determinants of Health) assessments and interventions performed: Yes  SDOH Screenings   Alcohol Screen: Not on file  Depression (PHQ2-9): Low Risk   . PHQ-2 Score: 0  Financial Resource Strain: Not on file  Food Insecurity: No Food Insecurity  . Worried About Charity fundraiser in the Last Year: Never true  . Ran Out of Food in the Last Year: Never true  Housing: Low Risk   . Last Housing Risk Score: 0  Physical Activity: Not on file  Social Connections: Not on file  Stress: Not on file  Tobacco Use: Medium Risk  . Smoking Tobacco Use: Former Smoker  . Smokeless Tobacco Use: Never Used  Transportation Needs: No Transportation Needs  . Lack of Transportation (Medical): No  . Lack of Transportation (Non-Medical): No    CCM Care Plan  No Known Allergies  Medications Reviewed Today    Reviewed by Burnice Logan, [facility] (Pharmacist) on 12/21/20 at [phone]  Med List Status: <None>  Medication Order Taking? Sig Documenting Provider Last Dose Status Informant  aspirin EC 81 MG tablet [id number] Yes Take 81 mg by mouth daily. Swallow whole. [provider] Taking Active   Boswellia-Glucosamine-Vit D (OSTEO BI-FLEX ONE PER DAY PO) [id number] Yes Take 1 tablet by mouth daily. [provider] Taking Active   carvedilol (COREG) 6.25 MG tablet [id number] Yes Take 1 tablet (6.25 mg total) by mouth 2 (two) times daily. Marge Duncans, PA-C Taking Active   cetirizine (ZYRTEC) 10 MG tablet [id number] Yes Take 10 mg by mouth daily. [provider] Taking Active   co-enzyme Q-10 30 MG capsule [id number] Yes Take 100 mg by mouth 3 (three) times daily. [provider] Taking Active   fenofibrate 160 MG tablet [id number] Yes Take 1 tablet (160 mg total) by mouth daily.  Marge Duncans, PA-C Taking Active   lisinopril (ZESTRIL) 40 MG tablet [id number] Yes Take 1 tablet (40 mg total) by mouth 2 (two) times daily. Marge Duncans, PA-C Taking Active   Misc Natural Products (PROSTATE SUPPORT) 300-15 MG TABS [id number] Yes Take 1 tablet by mouth daily. [provider] Taking Active   nitroGLYCERIN (NITROSTAT) 0.4 MG SL tablet [id number] Yes Place 0.4 mg under the tongue every 5 (five) minutes as needed for chest pain. [provider] Taking Active   pravastatin (PRAVACHOL) 40 MG tablet [id number] Yes TAKE 1 TABLET DAILY WITH   THE EVENING MEAL Cox, Kirsten, MD Taking Active           Patient Active Problem List   Diagnosis Date Noted  . Mixed hyperlipidemia 12/13/2019  . Hypertensive heart disease without heart failure 12/13/2019  . Overweight with body mass index (BMI) of 29 to 29.9 in adult 12/13/2019    Immunization History  Administered Date(s) Administered  . Fluad Quad(high Dose 65+) 04/21/2020  . Moderna Sars-Covid-2 Vaccination 10/02/2019, 10/30/2019, 07/01/2020  . Pneumococcal Conjugate-13 06/25/2014  . Pneumococcal Polysaccharide-23 06/09/2013  . Tdap 08/22/2010, 04/21/2020  . Zoster 08/20/2012    Conditions to be addressed/monitored:  Hypertension and Hyperlipidemia  Care Plan : CCM Pharmacy Care Plan  Updates made  by Burnice Logan, [place] since 12/21/2020 12:00 AM    Problem: htn, hld   Priority: High  Onset Date: 12/21/2020    Long-Range Goal: Disease State Management   Start Date: 12/21/2020  Expected End Date: 12/21/2021  This Visit's Progress: On track  Priority: High  Note:   Current Barriers:  . Suboptimal therapeutic regimen for cholesterol due to leg cramps  Pharmacist Clinical Goal(s):  Marland Kitchen Patient will maintain control of cholesterol as evidenced by lipid panel  through collaboration with PharmD and provider.   Interventions: . 1:1 collaboration with Rochel Brome, MD regarding development and update of comprehensive plan of care as evidenced by provider attestation and co-signature . Inter-disciplinary care team collaboration (see longitudinal plan of care) . Comprehensive medication review performed; medication list updated in electronic medical record  Hypertension (BP goal <130/80) -Controlled -Current treatment: . Lisinopril 40 mg twice daily  . Carvedilol 6.25 mg bid -Medications previously tried: none reported  -Current home readings: 121/57, 138/60, 158/74 (before medicine) -Current dietary habits: tries to limit salt  -Current exercise habits: works around house and outdoors daily -Denies hypotensive/hypertensive symptoms -Educated on BP goals and benefits of medications for prevention of heart attack, stroke and kidney damage; Daily salt intake goal < 2300 mg; Exercise goal of 150 minutes per week; Importance of home blood pressure monitoring; -Counseled to monitor BP at home weekly, document, and provide log at future appointments -Counseled on diet and exercise extensively Recommended to continue current medication  Hyperlipidemia/CAD: (LDL goal < 55) -Not ideally controlled -Current treatment: . fenofibrate 160 mg daily  . Pravastatin 40 mg daily  . Aspirin 81 mg daily  . Nitroglycerin 0.4 mg sl prn chest pain -Medications previously tried: atorvastatin (leg pain)   -Current dietary patterns: lower sodium and limiting fried foods -Current exercise habits: very active around house -Educated on Cholesterol goals;  Benefits of statin for ASCVD risk reduction; Importance of limiting foods high in cholesterol; Exercise goal of 150 minutes per week; -Counseled on diet and exercise extensively Recommended to continue current medication   Patient Goals/Self-Care Activities . Patient will:  - take medications as prescribed focus on medication adherence by using pill box check blood pressure  weekly, document, and provide at future appointments target a minimum of 150 minutes of moderate intensity exercise weekly engage in dietary modifications by limiting sodium and fried/fatty foods  Follow Up Plan: Telephone follow up appointment with care management team member scheduled for: 12/2021      Medication Assistance: None required.  Patient affirms current coverage meets needs.  Patient's preferred pharmacy is:  [facility], [place] [address] Phone: [phone] Fax: [facility] - [place], [place] [address] Phone: [phone] Fax: [phone]  Uses pill box? Yes Pt endorses 100% compliance  We discussed: Current pharmacy is preferred with insurance plan and patient is satisfied with pharmacy services Patient decided to: Continue current medication management strategy  Care Plan and Follow Up Patient Decision:  Patient agrees to Care Plan and Follow-up.  Plan: Telephone follow up appointment with care management team member scheduled for:  12/2021

## 2020-12-21 ENCOUNTER — Ambulatory Visit (INDEPENDENT_AMBULATORY_CARE_PROVIDER_SITE_OTHER): Payer: Medicare Other

## 2020-12-21 ENCOUNTER — Other Ambulatory Visit: Payer: Self-pay

## 2020-12-21 DIAGNOSIS — E782 Mixed hyperlipidemia: Secondary | ICD-10-CM

## 2020-12-21 DIAGNOSIS — I119 Hypertensive heart disease without heart failure: Secondary | ICD-10-CM | POA: Diagnosis not present

## 2020-12-21 NOTE — Patient Instructions (Addendum)
Visit Information  Goals Addressed            This Visit's Progress   . Learn More About My Health       Timeframe:  Long-Range Goal Priority:  High Start Date:                             Expected End Date:                        Follow Up Date 12/2021    - tell my story and reason for my visit - make a list of questions - ask questions - repeat what I heard to make sure I understand - bring a list of my medicines to the visit - speak up when I don't understand    Why is this important?    The best way to learn about your health and care is by talking to the doctor and nurse.   They will answer your questions and give you information in the way that you like best.    Notes:     Marland Kitchen Manage My Medicine       Timeframe:  Long-Range Goal Priority:  High Start Date:                             Expected End Date:                       Follow Up Date 12/2021    - call for medicine refill 2 or 3 days before it runs out - keep a list of all the medicines I take; vitamins and herbals too - use a pillbox to sort medicine    Why is this important?   . These steps will help you keep on track with your medicines.   Notes:     . Track and Manage My Blood Pressure-Hypertension       Timeframe:  Long-Range Goal Priority:  High Start Date:                             Expected End Date:                       Follow Up Date 12/2021    - check blood pressure weekly - write blood pressure results in a log or diary    Why is this important?    You won't feel high blood pressure, but it can still hurt your blood vessels.   High blood pressure can cause heart or kidney problems. It can also cause a stroke.   Making lifestyle changes like losing a little weight or eating less salt will help.   Checking your blood pressure at home and at different times of the day can help to control blood pressure.   If the doctor prescribes medicine remember to take it the way the doctor  ordered.   Call the office if you cannot afford the medicine or if there are questions about it.     Notes:       Patient Care Plan: CCM Pharmacy Care Plan    Problem Identified: htn, hld   Priority: High  Onset Date: 12/21/2020    Long-Range Goal: Disease State Management   Start Date: 12/21/2020  Expected End Date: 12/21/2021  This Visit's Progress: On track  Priority: High  Note:   Current Barriers:  . Suboptimal therapeutic regimen for cholesterol due to leg cramps  Pharmacist Clinical Goal(s):  Marland Kitchen Patient will maintain control of cholesterol as evidenced by lipid panel  through collaboration with PharmD and provider.   Interventions: . 1:1 collaboration with Rochel Brome, MD regarding development and update of comprehensive plan of care as evidenced by provider attestation and co-signature . Inter-disciplinary care team collaboration (see longitudinal plan of care) . Comprehensive medication review performed; medication list updated in electronic medical record  Hypertension (BP goal <130/80) -Controlled -Current treatment: . Lisinopril 40 mg twice daily  . Carvedilol 6.25 mg bid -Medications previously tried: none reported  -Current home readings: 121/57, 138/60, 158/74 (before medicine) -Current dietary habits: tries to limit salt  -Current exercise habits: works around house and outdoors daily -Denies hypotensive/hypertensive symptoms -Educated on BP goals and benefits of medications for prevention of heart attack, stroke and kidney damage; Daily salt intake goal < 2300 mg; Exercise goal of 150 minutes per week; Importance of home blood pressure monitoring; -Counseled to monitor BP at home weekly, document, and provide log at future appointments -Counseled on diet and exercise extensively Recommended to continue current medication  Hyperlipidemia/CAD: (LDL goal < 55) -Not ideally controlled -Current treatment: . fenofibrate 160 mg daily  . Pravastatin 40 mg daily   . Aspirin 81 mg daily  . Nitroglycerin 0.4 mg sl prn chest pain -Medications previously tried: atorvastatin (leg pain)  -Current dietary patterns: lower sodium and limiting fried foods -Current exercise habits: very active around house -Educated on Cholesterol goals;  Benefits of statin for ASCVD risk reduction; Importance of limiting foods high in cholesterol; Exercise goal of 150 minutes per week; -Counseled on diet and exercise extensively Recommended to continue current medication   Patient Goals/Self-Care Activities . Patient will:  - take medications as prescribed focus on medication adherence by using pill box check blood pressure weekly, document, and provide at future appointments target a minimum of 150 minutes of moderate intensity exercise weekly engage in dietary modifications by limiting sodium and fried/fatty foods  Follow Up Plan: Telephone follow up appointment with care management team member scheduled for: 12/2021      Patient verbalizes understanding of instructions provided today and agrees to view in [place].  Telephone follow up appointment with pharmacy team member scheduled for: 12/2021 unless patient needs pharmacist sooner  Burnice Logan, [facility]  Preventing High Cholesterol Cholesterol is a white, waxy substance similar to fat that the human body needs to help build cells. The liver makes all the cholesterol that a person's body needs. Having high cholesterol (hypercholesterolemia) increases your risk for heart disease and stroke. Extra or excess cholesterol comes from the food that you eat. High cholesterol can often be prevented with diet and lifestyle changes. If you already have high cholesterol, you can control it with diet, lifestyle changes, and medicines. How can high cholesterol affect me? If you have high cholesterol, fatty deposits (plaques) may build up on the walls of your blood vessels. The blood vessels that carry blood away from your heart  are called arteries. Plaques make the arteries narrower and stiffer. This in turn can:  Restrict or block blood flow and cause blood clots to form.  Increase your risk for heart attack and stroke. What can increase my risk for high cholesterol? This condition is more likely to develop in people who:  Eat foods that  are high in saturated fat or cholesterol. Saturated fat is mostly found in foods that come from animal sources.  Are overweight.  Are not getting enough exercise.  Have a family history of high cholesterol (familial hypercholesterolemia). What actions can I take to prevent this? Nutrition  Eat less saturated fat.  Avoid trans fats (partially hydrogenated oils). These are often found in margarine and in some baked goods, fried foods, and snacks bought in packages.  Avoid precooked or cured meat, such as bacon, sausages, or meat loaves.  Avoid foods and drinks that have added sugars.  Eat more fruits, vegetables, and whole grains.  Choose healthy sources of protein, such as fish, poultry, lean cuts of red meat, beans, peas, lentils, and nuts.  Choose healthy sources of fat, such as: ? Nuts. ? Vegetable oils, especially olive oil. ? Fish that have healthy fats, such as omega-3 fatty acids. These fish include mackerel or salmon.   Lifestyle  Lose weight if you are overweight. Maintaining a healthy body mass index (BMI) can help prevent or control high cholesterol. It can also lower your risk for diabetes and high blood pressure. Ask your health care provider to help you with a diet and exercise plan to lose weight safely.  Do not use any products that contain nicotine or tobacco, such as cigarettes, e-cigarettes, and chewing tobacco. If you need help quitting, ask your health care provider. Alcohol use  Do not drink alcohol if: ? Your health care provider tells you not to drink. ? You are pregnant, may be pregnant, or are planning to become pregnant.  If you drink  alcohol: ? Limit how much you use to:  0-1 drink a day for women.  0-2 drinks a day for men. ? Be aware of how much alcohol is in your drink. In the [place] one drink equals one 12 oz bottle of beer (355 mL), one 5 oz glass of wine (148 mL), or one 1 oz glass of hard liquor (44 mL). Activity  Get enough exercise. Do exercises as told by your health care provider.  Each week, do at least 150 minutes of exercise that takes a medium level of effort (moderate-intensity exercise). This kind of exercise: ? Makes your heart beat faster while allowing you to still be able to talk. ? Can be done in short sessions several times a day or longer sessions a few times a week. For example, on 5 days each week, you could walk fast or ride your bike 3 times a day for 10 minutes each time.   Medicines  Your health care provider may recommend medicines to help lower cholesterol. This may be a medicine to lower the amount of cholesterol that your liver makes. You may need medicine if: ? Diet and lifestyle changes have not lowered your cholesterol enough. ? You have high cholesterol and other risk factors for heart disease or stroke.  Take over-the-counter and prescription medicines only as told by your health care provider. General information  Manage your risk factors for high cholesterol. Talk with your health care provider about all your risk factors and how to lower your risk.  Manage other conditions that you have, such as diabetes or high blood pressure (hypertension).  Have blood tests to check your cholesterol levels at regular points in time as told by your health care provider.  Keep all follow-up visits as told by your health care provider. This is important. Where to find more information  American Heart Association: www.heart.org  National Heart, Lung, and Blood Institute: https://wilson-eaton.com/ Summary  High cholesterol increases your risk for heart disease and stroke. By keeping your  cholesterol level low, you can reduce your risk for these conditions.  High cholesterol can often be prevented with diet and lifestyle changes.  Work with your health care provider to manage your risk factors, and have your blood tested regularly. This information is not intended to replace advice given to you by your health care provider. Make sure you discuss any questions you have with your health care provider. Document Revised: 05/19/2019 Document Reviewed: 05/19/2019 Elsevier Patient Education  [place].

## 2021-01-05 ENCOUNTER — Other Ambulatory Visit: Payer: Medicare Other

## 2021-01-05 DIAGNOSIS — E782 Mixed hyperlipidemia: Secondary | ICD-10-CM | POA: Diagnosis not present

## 2021-01-05 DIAGNOSIS — I119 Hypertensive heart disease without heart failure: Secondary | ICD-10-CM

## 2021-01-06 LAB — CBC WITH DIFFERENTIAL/PLATELET
Basophils Absolute: 0.1 10*3/uL (ref 0.0–0.2)
Basos: 1 %
EOS (ABSOLUTE): 0.4 10*3/uL (ref 0.0–0.4)
Eos: 6 %
Hematocrit: 43.1 % (ref 37.5–51.0)
Hemoglobin: 14.6 g/dL (ref 13.0–17.7)
Immature Grans (Abs): 0 10*3/uL (ref 0.0–0.1)
Immature Granulocytes: 0 %
Lymphocytes Absolute: 1.3 10*3/uL (ref 0.7–3.1)
Lymphs: 20 %
MCH: 29.9 pg (ref 26.6–33.0)
MCHC: 33.9 g/dL (ref 31.5–35.7)
MCV: 88 fL (ref 79–97)
Monocytes Absolute: 0.6 10*3/uL (ref 0.1–0.9)
Monocytes: 9 %
Neutrophils Absolute: 4.2 10*3/uL (ref 1.4–7.0)
Neutrophils: 64 %
Platelets: 208 10*3/uL (ref 150–450)
RBC: 4.88 x10E6/uL (ref 4.14–5.80)
RDW: 13.1 % (ref 11.6–15.4)
WBC: 6.6 10*3/uL (ref 3.4–10.8)

## 2021-01-06 LAB — COMPREHENSIVE METABOLIC PANEL
ALT: 29 IU/L (ref 0–44)
AST: 27 IU/L (ref 0–40)
Albumin/Globulin Ratio: 2 (ref 1.2–2.2)
Albumin: 4.5 g/dL (ref 3.7–4.7)
Alkaline Phosphatase: 52 IU/L (ref 44–121)
BUN/Creatinine Ratio: 17 (ref 10–24)
BUN: 20 mg/dL (ref 8–27)
Bilirubin Total: 0.4 mg/dL (ref 0.0–1.2)
CO2: 24 mmol/L (ref 20–29)
Calcium: 9.6 mg/dL (ref 8.6–10.2)
Chloride: 105 mmol/L (ref 96–106)
Creatinine, Ser: 1.18 mg/dL (ref 0.76–1.27)
Globulin, Total: 2.2 g/dL (ref 1.5–4.5)
Glucose: 87 mg/dL (ref 65–99)
Potassium: 5 mmol/L (ref 3.5–5.2)
Sodium: 141 mmol/L (ref 134–144)
Total Protein: 6.7 g/dL (ref 6.0–8.5)
eGFR: 64 mL/min/{1.73_m2} (ref >59)

## 2021-01-06 LAB — LIPID PANEL
Chol/HDL Ratio: 4.5 ratio (ref 0.0–5.0)
Cholesterol, Total: 162 mg/dL (ref 100–199)
HDL: 36 mg/dL — ABNORMAL LOW (ref >39)
LDL Chol Calc (NIH): 102 mg/dL — ABNORMAL HIGH (ref 0–99)
Triglycerides: 132 mg/dL (ref 0–149)
VLDL Cholesterol Cal: 24 mg/dL (ref 5–40)

## 2021-01-06 LAB — CARDIOVASCULAR RISK ASSESSMENT

## 2021-01-08 ENCOUNTER — Encounter: Payer: Self-pay | Admitting: Family Medicine

## 2021-01-09 ENCOUNTER — Encounter: Payer: Self-pay | Admitting: Family Medicine

## 2021-01-09 ENCOUNTER — Other Ambulatory Visit: Payer: Self-pay

## 2021-01-09 ENCOUNTER — Ambulatory Visit (INDEPENDENT_AMBULATORY_CARE_PROVIDER_SITE_OTHER): Payer: Medicare Other | Admitting: Family Medicine

## 2021-01-09 ENCOUNTER — Ambulatory Visit (INDEPENDENT_AMBULATORY_CARE_PROVIDER_SITE_OTHER): Payer: Medicare Other

## 2021-01-09 VITALS — BP 142/60 | HR 80 | Temp 97.2°F | Resp 16 | Ht 69.0 in | Wt 191.2 lb

## 2021-01-09 DIAGNOSIS — I2583 Coronary atherosclerosis due to lipid rich plaque: Secondary | ICD-10-CM | POA: Diagnosis not present

## 2021-01-09 DIAGNOSIS — M79605 Pain in left leg: Secondary | ICD-10-CM

## 2021-01-09 DIAGNOSIS — E782 Mixed hyperlipidemia: Secondary | ICD-10-CM

## 2021-01-09 DIAGNOSIS — I119 Hypertensive heart disease without heart failure: Secondary | ICD-10-CM | POA: Diagnosis not present

## 2021-01-09 DIAGNOSIS — Z23 Encounter for immunization: Secondary | ICD-10-CM

## 2021-01-09 DIAGNOSIS — Z6828 Body mass index (BMI) 28.0-28.9, adult: Secondary | ICD-10-CM

## 2021-01-09 DIAGNOSIS — I2510 Atherosclerotic heart disease of native coronary artery without angina pectoris: Secondary | ICD-10-CM | POA: Diagnosis not present

## 2021-01-09 DIAGNOSIS — M79604 Pain in right leg: Secondary | ICD-10-CM | POA: Diagnosis not present

## 2021-01-09 DIAGNOSIS — I252 Old myocardial infarction: Secondary | ICD-10-CM

## 2021-01-09 MED ORDER — CARVEDILOL 12.5 MG PO TABS: 12.5000 mg | ORAL_TABLET | Freq: Two times a day (BID) | ORAL | 0 refills | Status: DC

## 2021-01-09 MED ORDER — ROSUVASTATIN CALCIUM 10 MG PO TABS: 20.0000 mg | ORAL_TABLET | Freq: Every day | ORAL | 0 refills | Status: DC

## 2021-01-09 NOTE — Progress Notes (Signed)
Subjective:  Patient ID: Bryan Zimmerman, male    DOB: 1946-06-02  Age: 75 y.o. MRN: [id number]  Chief Complaint  Patient presents with  . Hypertension    HPI  Mixed hyperlipidemia Fenofibrate 160 mg once daily and pravastatin 40 mg once daily. maintains a healthy diet  Hypertensive heart disease without heart failure. Known CAD. History of myocardial infarction. Carvedilol 6.25 mg one twice  Day and Lisinopril 40 mg one twice a day. bp has been up at home some.   C/o BL lower leg pain (btwn knees and ankles) that resolves after resting for a few minutes has knee pain bl also. Has OA.  Current Outpatient Medications on File Prior to Visit  Medication Sig Dispense Refill  . aspirin EC 81 MG tablet Take 81 mg by mouth daily. Swallow whole.    . Boswellia-Glucosamine-Vit D (OSTEO BI-FLEX ONE PER DAY PO) Take 1 tablet by mouth daily.    . cetirizine (ZYRTEC) 10 MG tablet Take 10 mg by mouth daily.    Marland Kitchen co-enzyme Q-10 30 MG capsule Take 100 mg by mouth 3 (three) times daily.    . fenofibrate 160 MG tablet Take 1 tablet (160 mg total) by mouth daily. 90 tablet 3  . lisinopril (ZESTRIL) 40 MG tablet Take 1 tablet (40 mg total) by mouth 2 (two) times daily. 180 tablet 3  . Misc Natural Products (PROSTATE SUPPORT) 300-15 MG TABS Take 1 tablet by mouth daily.    . nitroGLYCERIN (NITROSTAT) 0.4 MG SL tablet Place 0.4 mg under the tongue every 5 (five) minutes as needed for chest pain.     No current facility-administered medications on file prior to visit.   Past Medical History:  Diagnosis Date  . CAD (coronary artery disease)   . History of heart attack   . History of kidney stones   . Hypertensive heart disease without heart failure   . Mixed hyperlipidemia   . Osteoarthritis    Past Surgical History:  Procedure Laterality Date  . CATARACT EXTRACTION, BILATERAL    . HERNIA REPAIR    . 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 stints      Family History  Problem Relation Age  of Onset  . Dementia Mother   . Cancer Father        pancreatic  . Hypertension Other   . Hyperlipidemia Other    Social History   Socioeconomic History  . Marital status: Married    Spouse name: Not on file  . Number of children: Not on file  . Years of education: Not on file  . Highest education level: Not on file  Occupational History  . Not on file  Tobacco Use  . Smoking status: Former Smoker    Types: Cigarettes  . Smokeless tobacco: Never Used  Substance and Sexual Activity  . Alcohol use: Never  . Drug use: Never  . Sexual activity: Not on file  Other Topics Concern  . Not on file  Social History Narrative  . Not on file   Social Determinants of Health   Financial Resource Strain: Not on file  Food Insecurity: No Food Insecurity  . Worried About Charity fundraiser in the Last Year: Never true  . Ran Out of Food in the Last Year: Never true  Transportation Needs: No Transportation Needs  . Lack of Transportation (Medical): No  . Lack of Transportation (Non-Medical): No  Physical Activity: Not on file  Stress: Not on file  Social Connections: Not on file    Review of Systems  Constitutional: Negative for chills, diaphoresis, fatigue and fever.  HENT: Negative for congestion, ear pain and sore throat.   Respiratory: Negative for cough and shortness of breath.   Cardiovascular: Negative for chest pain and leg swelling.  Gastrointestinal: Negative for abdominal pain, constipation, diarrhea, nausea and vomiting.  Endocrine: Negative for polydipsia and polyphagia.  Genitourinary: Negative for dysuria and urgency.  Musculoskeletal: Negative for arthralgias, back pain and myalgias.  Neurological: Negative for dizziness and headaches.  Psychiatric/Behavioral: Negative for dysphoric mood.     Objective:  BP (!) 142/60   Pulse 80   Temp (!) 97.2 F (36.2 C)   Resp 16   Ht 5\' 9"  (1.753 m)   Wt 191 lb 3.2 oz (86.7 kg)   BMI 28.24 kg/m   BP/Weight  01/09/2021 40/98/1191 11/24/8293  Systolic BP 621 308 657  Diastolic BP 60 78 78  Wt. (Lbs) 191.2 193 198.8  BMI 28.24 28.5 29.36    Physical Exam Vitals reviewed.  Constitutional:      Appearance: Normal appearance. He is normal weight.  Cardiovascular:     Rate and Rhythm: Normal rate and regular rhythm.     Pulses: Normal pulses.     Heart sounds: No murmur heard.   Pulmonary:     Effort: Pulmonary effort is normal.     Breath sounds: Normal breath sounds.  Abdominal:     General: Abdomen is flat. Bowel sounds are normal.     Palpations: Abdomen is soft.     Tenderness: There is no abdominal tenderness.  Musculoskeletal:     Right lower leg: No edema.     Left lower leg: No edema.  Neurological:     Mental Status: He is alert and oriented to person, place, and time.  Psychiatric:        Mood and Affect: Mood normal.        Behavior: Behavior normal.     Diabetic Foot Exam - Simple   Simple Foot Form Visual Inspection No deformities, no ulcerations, no other skin breakdown bilaterally: Yes Sensation Testing Intact to touch and monofilament testing bilaterally: Yes Pulse Check Posterior Tibialis and Dorsalis pulse intact bilaterally: Yes Comments      Lab Results  Component Value Date   WBC 6.6 01/05/2021   HGB 14.6 01/05/2021   HCT 43.1 01/05/2021   PLT 208 01/05/2021   GLUCOSE 87 01/05/2021   CHOL 162 01/05/2021   TRIG 132 01/05/2021   HDL 36 (L) 01/05/2021   LDLCALC 102 (H) 01/05/2021   ALT 29 01/05/2021   AST 27 01/05/2021   NA 141 01/05/2021   K 5.0 01/05/2021   CL 105 01/05/2021   CREATININE 1.18 01/05/2021   BUN 20 01/05/2021   CO2 24 01/05/2021   TSH 1.220 06/03/2020   INR 0.96 08/22/2010      Assessment & Plan:   1. Mixed hyperlipidemia Recommend continue to work on eating healthy diet and exercise. Not at goal with known CAD. Change pravastatin to rosuvastatin (crestor) 10 mg once daily.  Continue fenofibrate.  - rosuvastatin  (CRESTOR) 10 MG tablet; Take 2 tablets (20 mg total) by mouth daily.  Dispense: 90 tablet; Refill: 0 - Lipid panel; Future  2. Hypertensive heart disease without heart failure Not at goal. Increase carvedilol 12.5 mg one twice a day - CBC with Differential/Platelet; Future - Comprehensive metabolic panel; Future  3. Coronary artery disease due to lipid rich  plaque See above.   4. Pain in both lower extremities Good pulses. contiue tylenol.  5. History of myocardial infarction On aspirin 81 mg daily, statin.  6. BMI 28.0-28.9,adult Recommend continue to work on eating healthy diet and exercise.      Meds ordered this encounter  Medications  . rosuvastatin (CRESTOR) 10 MG tablet    Sig: Take 2 tablets (20 mg total) by mouth daily.    Dispense:  90 tablet    Refill:  0    DISCONTINUE PRAVASTATIN  . carvedilol (COREG) 12.5 MG tablet    Sig: Take 1 tablet (12.5 mg total) by mouth 2 (two) times daily with a meal.    Dispense:  180 tablet    Refill:  0    Orders Placed This Encounter  Procedures  . CBC with Differential/Platelet  . Comprehensive metabolic panel  . Lipid panel     Follow-up: Return in about 3 months (around 04/11/2021) for comes early for fasting labs. .  An After Visit Summary was printed and given to the patient.  Rochel Brome, MD [facility] [phone]

## 2021-01-09 NOTE — Patient Instructions (Signed)
Increase carvedilol 12.5 mg one twice a day Change pravastatin to rosuvastatin (crestor) 10 mg once daily.  Continue fenofibrate.

## 2021-03-01 ENCOUNTER — Other Ambulatory Visit: Payer: Self-pay

## 2021-03-01 DIAGNOSIS — E782 Mixed hyperlipidemia: Secondary | ICD-10-CM

## 2021-03-01 MED ORDER — ROSUVASTATIN CALCIUM 10 MG PO TABS: 20.0000 mg | ORAL_TABLET | Freq: Every day | ORAL | 0 refills | Status: DC

## 2021-03-01 NOTE — Telephone Encounter (Signed)
Wife calling requesting 20 day supply sent to [facility] due to couple going out of town next week. Mail order will not make it before they leave. She is also requesting 90 day supply of medication sent to mail order for when they are back.   Royce Macadamia, [place] 03/01/21 4:18 PM

## 2021-03-20 ENCOUNTER — Other Ambulatory Visit: Payer: Self-pay | Admitting: Family Medicine

## 2021-03-20 DIAGNOSIS — E782 Mixed hyperlipidemia: Secondary | ICD-10-CM

## 2021-03-20 MED ORDER — ROSUVASTATIN CALCIUM 20 MG PO TABS: 20.0000 mg | ORAL_TABLET | Freq: Every day | ORAL | 0 refills | Status: DC

## 2021-03-20 MED ORDER — ROSUVASTATIN CALCIUM 20 MG PO TABS: 20.0000 mg | ORAL_TABLET | Freq: Every day | ORAL | 1 refills | Status: DC

## 2021-04-10 ENCOUNTER — Other Ambulatory Visit: Payer: Self-pay

## 2021-04-10 ENCOUNTER — Other Ambulatory Visit: Payer: Medicare Other

## 2021-04-10 DIAGNOSIS — I119 Hypertensive heart disease without heart failure: Secondary | ICD-10-CM | POA: Diagnosis not present

## 2021-04-10 DIAGNOSIS — E782 Mixed hyperlipidemia: Secondary | ICD-10-CM

## 2021-04-11 LAB — CBC WITH DIFFERENTIAL/PLATELET
Basophils Absolute: 0.1 10*3/uL (ref 0.0–0.2)
Basos: 1 %
EOS (ABSOLUTE): 0.4 10*3/uL (ref 0.0–0.4)
Eos: 7 %
Hematocrit: 39.1 % (ref 37.5–51.0)
Hemoglobin: 13.3 g/dL (ref 13.0–17.7)
Immature Grans (Abs): 0 10*3/uL (ref 0.0–0.1)
Immature Granulocytes: 0 %
Lymphocytes Absolute: 1.1 10*3/uL (ref 0.7–3.1)
Lymphs: 18 %
MCH: 29.4 pg (ref 26.6–33.0)
MCHC: 34 g/dL (ref 31.5–35.7)
MCV: 87 fL (ref 79–97)
Monocytes Absolute: 0.6 10*3/uL (ref 0.1–0.9)
Monocytes: 9 %
Neutrophils Absolute: 3.8 10*3/uL (ref 1.4–7.0)
Neutrophils: 65 %
Platelets: 175 10*3/uL (ref 150–450)
RBC: 4.52 x10E6/uL (ref 4.14–5.80)
RDW: 12.6 % (ref 11.6–15.4)
WBC: 5.9 10*3/uL (ref 3.4–10.8)

## 2021-04-11 LAB — COMPREHENSIVE METABOLIC PANEL
ALT: 27 IU/L (ref 0–44)
AST: 26 IU/L (ref 0–40)
Albumin/Globulin Ratio: 2 (ref 1.2–2.2)
Albumin: 4.2 g/dL (ref 3.7–4.7)
Alkaline Phosphatase: 43 IU/L — ABNORMAL LOW (ref 44–121)
BUN/Creatinine Ratio: 25 — ABNORMAL HIGH (ref 10–24)
BUN: 28 mg/dL — ABNORMAL HIGH (ref 8–27)
Bilirubin Total: 0.3 mg/dL (ref 0.0–1.2)
CO2: 23 mmol/L (ref 20–29)
Calcium: 9.2 mg/dL (ref 8.6–10.2)
Chloride: 105 mmol/L (ref 96–106)
Creatinine, Ser: 1.14 mg/dL (ref 0.76–1.27)
Globulin, Total: 2.1 g/dL (ref 1.5–4.5)
Glucose: 84 mg/dL (ref 65–99)
Potassium: 4.7 mmol/L (ref 3.5–5.2)
Sodium: 143 mmol/L (ref 134–144)
Total Protein: 6.3 g/dL (ref 6.0–8.5)
eGFR: 67 mL/min/{1.73_m2} (ref >59)

## 2021-04-11 LAB — LIPID PANEL
Chol/HDL Ratio: 3.1 ratio (ref 0.0–5.0)
Cholesterol, Total: 110 mg/dL (ref 100–199)
HDL: 35 mg/dL — ABNORMAL LOW (ref >39)
LDL Chol Calc (NIH): 57 mg/dL (ref 0–99)
Triglycerides: 95 mg/dL (ref 0–149)
VLDL Cholesterol Cal: 18 mg/dL (ref 5–40)

## 2021-04-11 LAB — CARDIOVASCULAR RISK ASSESSMENT

## 2021-04-12 ENCOUNTER — Encounter: Payer: Self-pay | Admitting: Family Medicine

## 2021-04-13 ENCOUNTER — Other Ambulatory Visit: Payer: Self-pay

## 2021-04-13 ENCOUNTER — Ambulatory Visit (INDEPENDENT_AMBULATORY_CARE_PROVIDER_SITE_OTHER): Payer: Medicare Other | Admitting: Family Medicine

## 2021-04-13 VITALS — BP 120/54 | HR 60 | Temp 97.4°F | Resp 18 | Ht 69.0 in | Wt 190.0 lb

## 2021-04-13 DIAGNOSIS — I119 Hypertensive heart disease without heart failure: Secondary | ICD-10-CM

## 2021-04-13 DIAGNOSIS — Z6828 Body mass index (BMI) 28.0-28.9, adult: Secondary | ICD-10-CM

## 2021-04-13 DIAGNOSIS — I2510 Atherosclerotic heart disease of native coronary artery without angina pectoris: Secondary | ICD-10-CM

## 2021-04-13 DIAGNOSIS — E782 Mixed hyperlipidemia: Secondary | ICD-10-CM

## 2021-04-13 DIAGNOSIS — I2583 Coronary atherosclerosis due to lipid rich plaque: Secondary | ICD-10-CM

## 2021-04-13 NOTE — Progress Notes (Signed)
Subjective:  Patient ID: Bryan Zimmerman, male    DOB: Nov 08, 1945  Age: 75 y.o. MRN: [id number]  Chief Complaint  Patient presents with   Hyperlipidemia   HPI Hyperlipidemia: Current medications: On crestor 20 mg once daily and fenofibrate 160 mg once daily Lifestyle changes: low fat diet. [place] a lot.   Hypertension: Complications: CAD. Hx of AMI.  Current medications: coreg 12.5 mg one twice a day. Lisinopril 40 mg one twice a day, aspirin 81 mg once daily.    Current Outpatient Medications on File Prior to Visit  Medication Sig Dispense Refill   aspirin EC 81 MG tablet Take 81 mg by mouth daily. Swallow whole.     Boswellia-Glucosamine-Vit D (OSTEO BI-FLEX ONE PER DAY PO) Take 1 tablet by mouth daily.     carvedilol (COREG) 12.5 MG tablet Take 1 tablet (12.5 mg total) by mouth 2 (two) times daily with a meal. 180 tablet 0   cetirizine (ZYRTEC) 10 MG tablet Take 10 mg by mouth daily.     co-enzyme Q-10 30 MG capsule Take 100 mg by mouth 3 (three) times daily.     fenofibrate 160 MG tablet Take 1 tablet (160 mg total) by mouth daily. 90 tablet 3   lisinopril (ZESTRIL) 40 MG tablet Take 1 tablet (40 mg total) by mouth 2 (two) times daily. 180 tablet 3   Misc Natural Products (PROSTATE SUPPORT) 300-15 MG TABS Take 1 tablet by mouth daily.     nitroGLYCERIN (NITROSTAT) 0.4 MG SL tablet Place 0.4 mg under the tongue every 5 (five) minutes as needed for chest pain.     rosuvastatin (CRESTOR) 20 MG tablet Take 1 tablet (20 mg total) by mouth daily. 90 tablet 1   No current facility-administered medications on file prior to visit.   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      Family History  Problem Relation Age of Onset   Dementia Mother    Cancer Father        pancreatic   Hypertension Other    Hyperlipidemia Other    Social History   Socioeconomic History   Marital status: Married    Spouse name: Not on file   Number of children: Not on file   Years of education: Not on file   Highest education level: Not on file  Occupational History   Not on file  Tobacco Use   Smoking status: Former    Types: Cigarettes   Smokeless tobacco: Never  Substance and Sexual Activity   Alcohol use: Never   Drug use: Never   Sexual activity: Not on file  Other Topics Concern   Not on file  Social History Narrative   Not on file   Social Determinants of Health   Financial Resource Strain: Not on file  Food Insecurity: No Food Insecurity   Worried About Running Out of Food in the Last Year: Never true   Ran Out of Food in the Last Year: Never true  Transportation Needs: No Transportation Needs   Lack of  Transportation (Medical): No   Lack of Transportation (Non-Medical): No  Physical Activity: Not on file  Stress: Not on file  Social Connections: Not on file    Review of Systems  Constitutional:  Negative for chills and fever.  HENT:  Negative for congestion, rhinorrhea and sore throat.   Respiratory:  Negative for cough and shortness of breath.   Cardiovascular:  Negative for chest pain and palpitations.  Gastrointestinal:  Negative for abdominal pain, constipation, diarrhea, nausea and vomiting.  Genitourinary:  Negative for dysuria and urgency.  Musculoskeletal:  Positive for arthralgias. Negative for back pain and myalgias.  Neurological:  Negative for dizziness and headaches.  Psychiatric/Behavioral:  Negative for dysphoric mood. The patient is not nervous/anxious.     Objective:  BP (!) 120/54   Pulse 60   Temp (!) 97.4 F (36.3 C)   Resp 18   Ht '5\' 9"'$  (1.753 m)   Wt 190 lb (86.2 kg)   BMI  28.06 kg/m   BP/Weight 04/13/2021 01/09/2021 [id number]  Systolic BP [id number]  Diastolic BP 54 60 78  Wt. (Lbs) 190 191.2 193  BMI 28.06 28.24 28.5    Physical Exam Vitals reviewed.  Constitutional:      Appearance: Normal appearance.  Neck:     Vascular: No carotid bruit.  Cardiovascular:     Rate and Rhythm: Normal rate and regular rhythm.     Pulses: Normal pulses.     Heart sounds: Normal heart sounds.  Pulmonary:     Effort: Pulmonary effort is normal.     Breath sounds: Normal breath sounds. No wheezing, rhonchi or rales.  Abdominal:     General: Bowel sounds are normal.     Palpations: Abdomen is soft.     Tenderness: There is no abdominal tenderness.  Neurological:     Mental Status: He is alert.  Psychiatric:        Mood and Affect: Mood normal.        Behavior: Behavior normal.    Diabetic Foot Exam - Simple   No data filed      Lab Results  Component Value Date   WBC 5.9 04/10/2021   HGB 13.3 04/10/2021   HCT 39.1 04/10/2021   PLT 175 04/10/2021   GLUCOSE 84 04/10/2021   CHOL 110 04/10/2021   TRIG 95 04/10/2021   HDL 35 (L) 04/10/2021   LDLCALC 57 04/10/2021   ALT 27 04/10/2021   AST 26 04/10/2021   NA 143 04/10/2021   K 4.7 04/10/2021   CL 105 04/10/2021   CREATININE 1.14 04/10/2021   BUN 28 (H) 04/10/2021   CO2 23 04/10/2021   TSH 1.220 06/03/2020   INR 0.96 08/22/2010    Assessment & Plan:   1. Hypertensive heart disease without heart failure Well controlled.  No changes to medicines.  Continue to work on eating a healthy diet and exercise.  Labs reviewed.  2. Mixed hyperlipidemia Well controlled.  No changes to medicines.  Continue to work on eating a healthy diet and exercise.  Labs reviewed.  3. Coronary artery disease due to lipid rich plaque Well controlled.  No changes to medicines.  Continue to work on eating a healthy diet and exercise.  Labs reviewed.  4. BMI 28.0-28.9,adult Recommend continue to work on eating  healthy diet and exercise.     No orders of the defined types were placed in this encounter.   No orders of the defined types were placed in this encounter.  Follow-up: Return in about 3 months (around 07/14/2021).  An After Visit Summary was printed and given to the patient.  Rochel Brome, MD [facility] [phone]

## 2021-04-14 ENCOUNTER — Other Ambulatory Visit: Payer: Self-pay | Admitting: Family Medicine

## 2021-04-16 ENCOUNTER — Encounter: Payer: Self-pay | Admitting: Family Medicine

## 2021-04-17 NOTE — Telephone Encounter (Signed)
Refill sent to pharmacy.   

## 2021-04-28 ENCOUNTER — Other Ambulatory Visit: Payer: Self-pay | Admitting: Family Medicine

## 2021-05-25 ENCOUNTER — Other Ambulatory Visit: Payer: Self-pay

## 2021-05-25 ENCOUNTER — Ambulatory Visit (INDEPENDENT_AMBULATORY_CARE_PROVIDER_SITE_OTHER): Payer: Medicare Other

## 2021-05-25 VITALS — BP 128/68 | HR 73 | Resp 16 | Ht 69.0 in | Wt 189.0 lb

## 2021-05-25 DIAGNOSIS — Z23 Encounter for immunization: Secondary | ICD-10-CM | POA: Diagnosis not present

## 2021-05-25 DIAGNOSIS — Z0000 Encounter for general adult medical examination without abnormal findings: Secondary | ICD-10-CM

## 2021-05-26 NOTE — Patient Instructions (Signed)
Fall Prevention in the Home, Adult Falls can cause injuries and can happen to people of all ages. There are many things you can do to make your home safe and to help prevent falls. Ask for help when making these changes. What actions can I take to prevent falls? General Instructions Use good lighting in all rooms. Replace any light bulbs that burn out. Turn on the lights in dark areas. Use night-lights. Keep items that you use often in easy-to-reach places. Lower the shelves around your home if needed. Set up your furniture so you have a clear path. Avoid moving your furniture around. Do not have throw rugs or other things on the floor that can make you trip. Avoid walking on wet floors. If any of your floors are uneven, fix them. Add color or contrast paint or tape to clearly mark and help you see: Grab bars or handrails. First and last steps of staircases. Where the edge of each step is. If you use a stepladder: Make sure that it is fully opened. Do not climb a closed stepladder. Make sure the sides of the stepladder are locked in place. Ask someone to hold the stepladder while you use it. Know where your pets are when moving through your home. What can I do in the bathroom?   Keep the floor dry. Clean up any water on the floor right away. Remove soap buildup in the tub or shower. Use nonskid mats or decals on the floor of the tub or shower. Attach bath mats securely with double-sided, nonslip rug tape. If you need to sit down in the shower, use a plastic, nonslip stool. Install grab bars by the toilet and in the tub and shower. Do not use towel bars as grab bars. What can I do in the bedroom? Make sure that you have a light by your bed that is easy to reach. Do not use any sheets or blankets for your bed that hang to the floor. Have a firm chair with side arms that you can use for support when you get dressed. What can I do in the kitchen? Clean up any spills right away. If you  need to reach something above you, use a step stool with a grab bar. Keep electrical cords out of the way. Do not use floor polish or wax that makes floors slippery. What can I do with my stairs? Do not leave any items on the stairs. Make sure that you have a light switch at the top and the bottom of the stairs. Make sure that there are handrails on both sides of the stairs. Fix handrails that are broken or loose. Install nonslip stair treads on all your stairs. Avoid having throw rugs at the top or bottom of the stairs. Choose a carpet that does not hide the edge of the steps on the stairs. Check carpeting to make sure that it is firmly attached to the stairs. Fix carpet that is loose or worn. What can I do on the outside of my home? Use bright outdoor lighting. Fix the edges of walkways and driveways and fix any cracks. Remove anything that might make you trip as you walk through a door, such as a raised step or threshold. Trim any bushes or trees on paths to your home. Check to see if handrails are loose or broken and that both sides of all steps have handrails. Install guardrails along the edges of any raised decks and porches. Clear paths of anything that can  make you trip, such as tools or rocks. Have leaves, snow, or ice cleared regularly. Use sand or salt on paths during winter. Clean up any spills in your garage right away. This includes grease or oil spills. What other actions can I take? Wear shoes that: Have a low heel. Do not wear high heels. Have rubber bottoms. Feel good on your feet and fit well. Are closed at the toe. Do not wear open-toe sandals. Use tools that help you move around if needed. These include: Canes. Walkers. Scooters. Crutches. Review your medicines with your doctor. Some medicines can make you feel dizzy. This can increase your chance of falling. Ask your doctor what else you can do to help prevent falls. Where to find more information Centers for  Disease Control and Prevention, [employer]: http://www.wolf.info/ National Institute on Aging: http://kim-miller.com/ Contact a doctor if: You are afraid of falling at home. You feel weak, drowsy, or dizzy at home. You fall at home. Summary There are many simple things that you can do to make your home safe and to help prevent falls. Ways to make your home safe include removing things that can make you trip and installing grab bars in the bathroom. Ask for help when making these changes in your home. This information is not intended to replace advice given to you by your health care provider. Make sure you discuss any questions you have with your health care provider. Document Revised: 03/09/2020 Document Reviewed: 03/09/2020 Elsevier Patient Education  Bryan Zimmerman Maintenance, Male Adopting a healthy lifestyle and getting preventive care are important in promoting health and wellness. Ask your health care provider about: The right schedule for you to have regular tests and exams. Things you can do on your own to prevent diseases and keep yourself healthy. What should I know about diet, weight, and exercise? Eat a healthy diet  Eat a diet that includes plenty of vegetables, fruits, low-fat dairy products, and lean protein. Do not eat a lot of foods that are high in solid fats, added sugars, or sodium. Maintain a healthy weight Body mass index (BMI) is a measurement that can be used to identify possible weight problems. It estimates body fat based on height and weight. Your health care provider can help determine your BMI and help you achieve or maintain a healthy weight. Get regular exercise Get regular exercise. This is one of the most important things you can do for your health. Most adults should: Exercise for at least 150 minutes each week. The exercise should increase your heart rate and make you sweat (moderate-intensity exercise). Do strengthening exercises at least twice a week. This  is in addition to the moderate-intensity exercise. Spend less time sitting. Even light physical activity can be beneficial. Watch cholesterol and blood lipids Have your blood tested for lipids and cholesterol at 76 years of age, then have this test every 5 years. You may need to have your cholesterol levels checked more often if: Your lipid or cholesterol levels are high. You are older than 76 years of age. You are at high risk for heart disease. What should I know about cancer screening? Many types of cancers can be detected early and may often be prevented. Depending on your health history and family history, you may need to have cancer screening at various ages. This may include screening for: Colorectal cancer. Prostate cancer. Skin cancer. Lung cancer. What should I know about heart disease, diabetes, and high blood pressure? Blood pressure and  heart disease High blood pressure causes heart disease and increases the risk of stroke. This is more likely to develop in people who have high blood pressure readings, are of African descent, or are overweight. Talk with your health care provider about your target blood pressure readings. Have your blood pressure checked: Every 3-5 years if you are 56-76 years of age. Every year if you are 34 years old or older. If you are between the ages of 68 and 49 and are a current or former smoker, ask your health care provider if you should have a one-time screening for abdominal aortic aneurysm (AAA). Diabetes Have regular diabetes screenings. This checks your fasting blood sugar level. Have the screening done: Once every three years after age 31 if you are at a normal weight and have a low risk for diabetes. More often and at a younger age if you are overweight or have a high risk for diabetes. What should I know about preventing infection? Hepatitis B If you have a higher risk for hepatitis B, you should be screened for this virus. Talk with your  health care provider to find out if you are at risk for hepatitis B infection. Hepatitis C Blood testing is recommended for: Everyone born from [birth year] through 1966. Anyone with known risk factors for hepatitis C. Sexually transmitted infections (STIs) You should be screened each year for STIs, including gonorrhea and chlamydia, if: You are sexually active and are younger than 76 years of age. You are older than 76 years of age and your health care provider tells you that you are at risk for this type of infection. Your sexual activity has changed since you were last screened, and you are at increased risk for chlamydia or gonorrhea. Ask your health care provider if you are at risk. Ask your health care provider about whether you are at high risk for HIV. Your health care provider may recommend a prescription medicine to help prevent HIV infection. If you choose to take medicine to prevent HIV, you should first get tested for HIV. You should then be tested every 3 months for as long as you are taking the medicine. Follow these instructions at home: Lifestyle Do not use any products that contain nicotine or tobacco, such as cigarettes, e-cigarettes, and chewing tobacco. If you need help quitting, ask your health care provider. Do not use street drugs. Do not share needles. Ask your health care provider for help if you need support or information about quitting drugs. Alcohol use Do not drink alcohol if your health care provider tells you not to drink. If you drink alcohol: Limit how much you have to 0-2 drinks a day. Be aware of how much alcohol is in your drink. In the [place] one drink equals one 12 oz bottle of beer (355 mL), one 5 oz glass of wine (148 mL), or one 1 oz glass of hard liquor (44 mL). General instructions Schedule regular health, dental, and eye exams. Stay current with your vaccines. Tell your health care provider if: You often feel depressed. You have ever been abused or do  not feel safe at home. Summary Adopting a healthy lifestyle and getting preventive care are important in promoting health and wellness. Follow your health care provider's instructions about healthy diet, exercising, and getting tested or screened for diseases. Follow your health care provider's instructions on monitoring your cholesterol and blood pressure. This information is not intended to replace advice given to you by your health care provider.  Make sure you discuss any questions you have with your health care provider. Document Revised: 10/14/2020 Document Reviewed: 07/30/2018 Elsevier Patient Education  2022 [employer].

## 2021-05-26 NOTE — Progress Notes (Signed)
Subjective:   Bryan Zimmerman is a 75 y.o. male who presents for Medicare Annual/Subsequent preventive examination. This wellness visit is conducted by a nurse.  The patient's medications were reviewed and reconciled since the patient's last visit.  History details were provided by the patient.  The history appears to be reliable.    Patient's last AWV was one year ago.   Medical History: Patient history and Family history was reviewed  Medications, Allergies, and preventative health maintenance was reviewed and updated.   Review of Systems    ROS-Negative       Objective:    Today's Vitals   05/25/21 1537  BP: 128/68  Pulse: 73  Resp: 16  SpO2: 94%  Weight: 189 lb (85.7 kg)  Height: 5\' 9"  (1.753 m)  PainSc: 0-No pain   Body mass index is 27.91 kg/m.  Advanced Directives 05/25/2021 12/02/2019  Does Patient Have a Medical Advance Directive? Yes Yes  Type of Paramedic of [place];Living will [place];Living will  Does patient want to make changes to medical advance directive? No - Patient declined -  Copy of [place] in Chart? No - copy requested -    Current Medications (verified) Outpatient Encounter Medications as of 05/25/2021  Medication Sig   aspirin EC 81 MG tablet Take 81 mg by mouth daily. Swallow whole.   Boswellia-Glucosamine-Vit D (OSTEO BI-FLEX ONE PER DAY PO) Take 1 tablet by mouth daily.   carvedilol (COREG) 12.5 MG tablet TAKE 1 TABLET TWICE DAILY WITH MEALS   cetirizine (ZYRTEC) 10 MG tablet Take 10 mg by mouth daily.   co-enzyme Q-10 30 MG capsule Take 100 mg by mouth 3 (three) times daily.   fenofibrate 160 MG tablet Take 1 tablet (160 mg total) by mouth daily.   lisinopril (ZESTRIL) 40 MG tablet Take 1 tablet (40 mg total) by mouth 2 (two) times daily.   Misc Natural Products (PROSTATE SUPPORT) 300-15 MG TABS Take 1 tablet by mouth daily.   nitroGLYCERIN (NITROSTAT) 0.4 MG SL tablet  Place 0.4 mg under the tongue every 5 (five) minutes as needed for chest pain.   rosuvastatin (CRESTOR) 10 MG tablet TAKE 2 TABLETS BY MOUTH DAILY   rosuvastatin (CRESTOR) 20 MG tablet Take 1 tablet (20 mg total) by mouth daily.   No facility-administered encounter medications on file as of 05/25/2021.    Allergies (verified) Patient has no known allergies.   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     Family History  Problem Relation Age of Onset   Dementia Mother    Cancer Father        pancreatic   Hypertension Other    Hyperlipidemia Other    Social History   Socioeconomic History   Marital status: Married    Spouse name: Bryan Zimmerman  Tobacco Use   Smoking status: Former    Types: Cigarettes   Smokeless tobacco: Never  Vaping Use   Vaping Use: Never used  Substance and Sexual Activity   Alcohol use:  Never   Drug use: Never   Sexual activity: Not on file   Social Determinants of Health   Financial Resource Strain: Not on file  Food Insecurity: No Food Insecurity   Worried About Running Out of Food in the Last Year: Never true   Ran Out of Food in the Last Year: Never true  Transportation Needs: No Transportation Needs   Lack of Transportation (Medical): No   Lack of Transportation (Non-Medical): No  Physical Activity: Not on file  Stress: Not on file  Social Connections: Not on file    Tobacco Counseling Counseling given: Patient does not use tobacco products   Clinical Intake:  Pre-visit preparation completed: Yes  Pain : No/denies pain Pain Score: 0-No pain   BMI - recorded: 27.91 Nutritional  Status: BMI 25 -29 Overweight Nutritional Risks: None Diabetes: No How often do you need to have someone help you when you read instructions, pamphlets, or other written materials from your doctor or pharmacy?: 1 - Never Interpreter Needed?: No   Activities of Daily Living In your present state of health, do you have any difficulty performing the following activities: 05/25/2021 01/09/2021  Hearing? N N  Vision? N N  Difficulty concentrating or making decisions? N N  Walking or climbing stairs? N N  Dressing or bathing? N N  Doing errands, shopping? N N  Preparing Food and eating ? N -  Using the Toilet? N -  In the past six months, have you accidently leaked urine? N -  Do you have problems with loss of bowel control? N -  Managing your Medications? N -  Managing your Finances? N -  Housekeeping or managing your Housekeeping? N -  Some recent data might be hidden    Patient Care Team: Rochel Brome, MD as PCP - General (Family Medicine) Park Liter, MD as Consulting Physician (Cardiology) Burnice Logan, [facility] (Inactive) as Pharmacist (Pharmacist)    Assessment:   This is a routine wellness examination for Bryan Zimmerman.  Hearing/Vision screen No results found.  Dietary issues and exercise activities discussed: Current Exercise Habits: The patient does not participate in regular exercise at present (however is very active around the home), Exercise limited by: None identified   Depression Screen PHQ 2/9 Scores 05/25/2021 01/09/2021 09/14/2020 03/21/2020 12/03/2019  PHQ - 2 Score 0 0 0 0 0    Fall Risk Fall Risk  05/25/2021 04/13/2021 01/09/2021 04/21/2020 03/21/2020  Falls in the past year? 0 0 0 1 0  Comment - - - - -  Number falls in past yr: 0 0 0 0 0  Injury with Fall? 0 0 0 0 -  Risk for fall due to : No Fall Risks - No Fall Risks - -  Follow up Falls evaluation completed;Education provided Falls evaluation completed Falls evaluation completed - -    FALL RISK PREVENTION  PERTAINING TO THE HOME:  Home free of loose throw rugs in walkways, pet beds, electrical cords, etc? Yes  Adequate lighting in your home to reduce risk of falls? Yes   ASSISTIVE DEVICES UTILIZED TO PREVENT FALLS:  Life alert? No  Use of a cane, walker or w/c? No   Gait steady and fast without use of assistive device  Cognitive Function:     6CIT Screen 05/26/2021  What Year? 0 points  What month? 0 points  What time? 0 points  Count back from 20 0 points  Months in reverse 0 points  Repeat phrase 0 points  Total Score 0    Immunizations Immunization History  Administered Date(s) Administered   Fluad Quad(high Dose 65+) 04/21/2020, 05/25/2021   Moderna SARS-COV2 Booster Vaccination 01/09/2021   Moderna Sars-Covid-2 Vaccination 10/02/2019, 10/30/2019, 07/01/2020   Pneumococcal Conjugate-13 06/25/2014   Pneumococcal Polysaccharide-23 06/09/2013   Tdap 08/22/2010, 04/21/2020   Zoster, Live 08/20/2012    TDAP status: Up to date  Flu Vaccine status: Completed at today's visit  Pneumococcal vaccine status: Up to date  Covid-19 vaccine status: Information provided on how to obtain vaccines.   Screening Tests Health Maintenance  Topic Date Due   Hepatitis C Screening  Never done   Zoster Vaccines- Shingrix (1 of 2) Never done   COVID-19 Vaccine (5 - Booster for Moderna series) 05/12/2021   TETANUS/TDAP  04/21/2030   INFLUENZA VACCINE  Completed   HPV VACCINES  Aged Out    Health Maintenance  Health Maintenance Due  Topic Date Due   Hepatitis C Screening  Never done   Zoster Vaccines- Shingrix (1 of 2) Never done   COVID-19 Vaccine (5 - Booster for Moderna series) 05/12/2021    Colorectal cancer screening: Type of screening: Colonoscopy. Completed 2015. Repeat every 10 years  Lung Cancer Screening: (Low Dose CT Chest recommended if Age 64-80 years, 30 pack-year currently smoking OR have quit w/in 15years.) does not qualify.   Additional Screening:  Vision  Screening: Recommended annual ophthalmology exams for early detection of glaucoma and other disorders of the eye. Is the patient up to date with their annual eye exam?  Yes  Who is the provider or what is the name of the office in which the patient attends annual eye exams? [facility]  Dental Screening: Recommended annual dental exams for proper oral hygiene    Plan:    1- Flu vaccine given at visit today 2- COVID Booster recommended - patient plans to get at next office visit 3- Healthy diet and exercise recommended  I have personally reviewed and noted the following in the patient's chart:   Medical and social history Use of alcohol, tobacco or illicit drugs  Current medications and supplements including opioid prescriptions. Patient is not currently taking opioid prescriptions. Functional ability and status Nutritional status Physical activity Advanced directives List of other physicians Hospitalizations, surgeries, and ER visits in previous 12 months Vitals Screenings to include cognitive, depression, and falls Referrals and appointments  In addition, I have reviewed and discussed with patient certain preventive protocols, quality metrics, and best practice recommendations. A written personalized care plan for preventive services as well as general preventive health recommendations were provided to patient.     Erie Noe, LPN   [id number]

## 2021-06-15 ENCOUNTER — Telehealth: Payer: Self-pay

## 2021-06-15 NOTE — Chronic Care Management (AMB) (Signed)
    Chronic Care Management Pharmacy Assistant   Name: Bryan Zimmerman  MRN: [id number] DOB: August 19, 1946   Reason for Encounter: Disease State call for Lipids     Recent office visits:  05/25/21 Bryan Hammock LPN. Medicare Wellness visit. No med changes  04/13/21 Cox, Kirsten MD. Seen for Hypertensive heart disease without heart failure. No med changes.   03/20/21 Orders Only. Increased Rosuvastatin 10 mg to 20 mg daily due to high lipid panel   01/09/21 Rochel Brome MD. Seen for Hypertensive heart disease. Started Rosuvastatin Calcium 20 mg daily. Modified Carvedilol from 6.25 mg 2 times daily to 12.5 mg 2 times daily.  Recent consult visits:  None   Hospital visits:  None   Medications: Outpatient Encounter Medications as of 06/15/2021  Medication Sig   aspirin EC 81 MG tablet Take 81 mg by mouth daily. Swallow whole.   Boswellia-Glucosamine-Vit D (OSTEO BI-FLEX ONE PER DAY PO) Take 1 tablet by mouth daily.   carvedilol (COREG) 12.5 MG tablet TAKE 1 TABLET TWICE DAILY WITH MEALS   cetirizine (ZYRTEC) 10 MG tablet Take 10 mg by mouth daily.   co-enzyme Q-10 30 MG capsule Take 100 mg by mouth 3 (three) times daily.   fenofibrate 160 MG tablet Take 1 tablet (160 mg total) by mouth daily.   lisinopril (ZESTRIL) 40 MG tablet Take 1 tablet (40 mg total) by mouth 2 (two) times daily.   Misc Natural Products (PROSTATE SUPPORT) 300-15 MG TABS Take 1 tablet by mouth daily.   nitroGLYCERIN (NITROSTAT) 0.4 MG SL tablet Place 0.4 mg under the tongue every 5 (five) minutes as needed for chest pain.   rosuvastatin (CRESTOR) 10 MG tablet TAKE 2 TABLETS BY MOUTH DAILY   rosuvastatin (CRESTOR) 20 MG tablet Take 1 tablet (20 mg total) by mouth daily.   No facility-administered encounter medications on file as of 06/15/2021.   Lipid Panel    Component Value Date/Time   CHOL 110 04/10/2021 1051   TRIG 95 04/10/2021 1051   HDL 35 (L) 04/10/2021 1051   LDLCALC 57 04/10/2021 1051    10-year  ASCVD risk score: The ASCVD Risk score (Arnett DK, et al., 2019) failed to calculate for the following reasons:   The patient has a prior MI or stroke diagnosis  Current antihyperlipidemic regimen:  Rosuvastatin 10 mg 2 times daily  Previous antihyperlipidemic medications tried:   ASCVD risk enhancing conditions: age >9 and HTN  What recent interventions/DTPs have been made by any provider to improve Cholesterol control since last CPP Visit:   Any recent hospitalizations or ED visits since last visit with CPP? No  What diet changes have been made to improve Cholesterol?    What exercise is being done to improve Cholesterol?    Adherence Review: Does the patient have >5 day gap between last estimated fill dates? Yes  Star Rating Drugs Medication Name Last Fill Days supply Rosuvastatin   03/04/21 20ds   Care Gaps: Last annual wellness visit?  [name], [place] Pharmacist Assistant  [phone]   I have been unable to reach pt to compete call

## 2021-07-09 ENCOUNTER — Other Ambulatory Visit: Payer: Self-pay | Admitting: Family Medicine

## 2021-07-09 DIAGNOSIS — I119 Hypertensive heart disease without heart failure: Secondary | ICD-10-CM

## 2021-07-09 DIAGNOSIS — E782 Mixed hyperlipidemia: Secondary | ICD-10-CM

## 2021-07-10 ENCOUNTER — Other Ambulatory Visit: Payer: Medicare Other

## 2021-07-10 DIAGNOSIS — E782 Mixed hyperlipidemia: Secondary | ICD-10-CM | POA: Diagnosis not present

## 2021-07-11 ENCOUNTER — Encounter: Payer: Self-pay | Admitting: Family Medicine

## 2021-07-11 LAB — COMPREHENSIVE METABOLIC PANEL
ALT: 22 IU/L (ref 0–44)
AST: 29 IU/L (ref 0–40)
Albumin/Globulin Ratio: 2.2 (ref 1.2–2.2)
Albumin: 4.3 g/dL (ref 3.7–4.7)
Alkaline Phosphatase: 45 IU/L (ref 44–121)
BUN/Creatinine Ratio: 20 (ref 10–24)
BUN: 22 mg/dL (ref 8–27)
Bilirubin Total: 0.3 mg/dL (ref 0.0–1.2)
CO2: 22 mmol/L (ref 20–29)
Calcium: 9.5 mg/dL (ref 8.6–10.2)
Chloride: 102 mmol/L (ref 96–106)
Creatinine, Ser: 1.1 mg/dL (ref 0.76–1.27)
Globulin, Total: 2 g/dL (ref 1.5–4.5)
Glucose: 88 mg/dL (ref 70–99)
Potassium: 4.8 mmol/L (ref 3.5–5.2)
Sodium: 137 mmol/L (ref 134–144)
Total Protein: 6.3 g/dL (ref 6.0–8.5)
eGFR: 70 mL/min/{1.73_m2} (ref >59)

## 2021-07-11 LAB — LIPID PANEL
Chol/HDL Ratio: 4 ratio (ref 0.0–5.0)
Cholesterol, Total: 135 mg/dL (ref 100–199)
HDL: 34 mg/dL — ABNORMAL LOW (ref >39)
LDL Chol Calc (NIH): 81 mg/dL (ref 0–99)
Triglycerides: 111 mg/dL (ref 0–149)
VLDL Cholesterol Cal: 20 mg/dL (ref 5–40)

## 2021-07-11 LAB — CARDIOVASCULAR RISK ASSESSMENT

## 2021-07-12 ENCOUNTER — Ambulatory Visit (INDEPENDENT_AMBULATORY_CARE_PROVIDER_SITE_OTHER): Payer: Medicare Other | Admitting: Family Medicine

## 2021-07-12 ENCOUNTER — Other Ambulatory Visit: Payer: Self-pay

## 2021-07-12 ENCOUNTER — Ambulatory Visit (INDEPENDENT_AMBULATORY_CARE_PROVIDER_SITE_OTHER): Payer: Medicare Other

## 2021-07-12 VITALS — BP 134/60 | HR 56 | Temp 97.4°F | Resp 16 | Ht 69.0 in | Wt 187.0 lb

## 2021-07-12 DIAGNOSIS — Z23 Encounter for immunization: Secondary | ICD-10-CM | POA: Diagnosis not present

## 2021-07-12 DIAGNOSIS — I119 Hypertensive heart disease without heart failure: Secondary | ICD-10-CM

## 2021-07-12 DIAGNOSIS — I2510 Atherosclerotic heart disease of native coronary artery without angina pectoris: Secondary | ICD-10-CM

## 2021-07-12 DIAGNOSIS — I2583 Coronary atherosclerosis due to lipid rich plaque: Secondary | ICD-10-CM

## 2021-07-12 DIAGNOSIS — Z6827 Body mass index (BMI) 27.0-27.9, adult: Secondary | ICD-10-CM

## 2021-07-12 DIAGNOSIS — E782 Mixed hyperlipidemia: Secondary | ICD-10-CM

## 2021-07-12 DIAGNOSIS — M1710 Unilateral primary osteoarthritis, unspecified knee: Secondary | ICD-10-CM | POA: Diagnosis not present

## 2021-07-12 NOTE — Progress Notes (Signed)
Subjective:  Patient ID: Bryan Zimmerman, male    DOB: 08-05-1946  Age: 75 y.o. MRN: [id number]  Chief Complaint  Patient presents with   Hyperlipidemia   Hypertension    HPI Hyperlipidemia: Patient is taking fenofibrate 160 mg daily, CoQ10 daily, rosuvastatin 20 mg daily. Hypertension: He takes Carvedilol 12.5 mg daily, lisinopril 40 mg daily, aspirin 81 mg daily. CAD: Nitroglycerin 0.4mg  PRN for chest pain. Allergies: He takes zyrtec 10 mg daily.  Current Outpatient Medications on File Prior to Visit  Medication Sig Dispense Refill   aspirin EC 81 MG tablet Take 81 mg by mouth daily. Swallow whole.     Boswellia-Glucosamine-Vit D (OSTEO BI-FLEX ONE PER DAY PO) Take 1 tablet by mouth daily.     carvedilol (COREG) 12.5 MG tablet TAKE 1 TABLET TWICE DAILY WITH MEALS 180 tablet 0   cetirizine (ZYRTEC) 10 MG tablet Take 10 mg by mouth daily.     co-enzyme Q-10 30 MG capsule Take 100 mg by mouth 3 (three) times daily.     fenofibrate 160 MG tablet Take 1 tablet (160 mg total) by mouth daily. 90 tablet 3   lisinopril (ZESTRIL) 40 MG tablet Take 1 tablet (40 mg total) by mouth 2 (two) times daily. 180 tablet 3   Misc Natural Products (PROSTATE SUPPORT) 300-15 MG TABS Take 1 tablet by mouth daily.     nitroGLYCERIN (NITROSTAT) 0.4 MG SL tablet Place 0.4 mg under the tongue every 5 (five) minutes as needed for chest pain.     rosuvastatin (CRESTOR) 20 MG tablet Take 1 tablet (20 mg total) by mouth daily. 90 tablet 1   No current facility-administered medications on file prior to visit.   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      Family  History  Problem Relation Age of Onset   Dementia Mother    Cancer Father        pancreatic   Hypertension Other    Hyperlipidemia Other    Social History   Socioeconomic History   Marital status: Married    Spouse name: [name]   Number of children: Not on file   Years of education: Not on file   Highest education level: Not on file  Occupational History   Not on file  Tobacco Use   Smoking status: Former    Types: Cigarettes   Smokeless tobacco: Never  Vaping Use   Vaping Use: Never used  Substance and Sexual Activity   Alcohol use: Never   Drug use: Never   Sexual activity: Not on file  Other Topics Concern   Not on file  Social History Narrative   Not on file   Social Determinants of Health   Financial Resource Strain: Not on file  Food Insecurity: No Food Insecurity   Worried About Running Out of Food in the Last Year: Never true   [place] in the Last Year: Never true  Transportation Needs: No Transportation Needs   Lack of Transportation (Medical): No   Lack of Transportation (Non-Medical):  No  Physical Activity: Not on file  Stress: Not on file  Social Connections: Not on file    Review of Systems  Constitutional:  Negative for chills and fever.  HENT:  Negative for congestion, rhinorrhea and sore throat.   Respiratory:  Negative for cough and shortness of breath.   Cardiovascular:  Negative for chest pain and palpitations.  Gastrointestinal:  Negative for abdominal pain, constipation, diarrhea, nausea and vomiting.  Genitourinary:  Negative for dysuria and urgency.  Musculoskeletal:  Positive for arthralgias. Negative for back pain and myalgias.  Neurological:  Negative for dizziness and headaches.  Psychiatric/Behavioral:  Negative for dysphoric mood. The patient is not nervous/anxious.     Objective:  BP 134/60   Pulse (!) 56   Temp (!) 97.4 F (36.3 C)   Resp 16   Ht 5\' 9"  (1.753 m)   Wt 187 lb (84.8 kg)   BMI 27.62 kg/m    BP/Weight 07/12/2021 05/25/2021 3/32/9518  Systolic BP 841 660 630  Diastolic BP 60 68 54  Wt. (Lbs) 187 189 190  BMI 27.62 27.91 28.06    Physical Exam Constitutional:      Appearance: Normal appearance.  Cardiovascular:     Rate and Rhythm: Normal rate and regular rhythm.     Pulses: Normal pulses.     Heart sounds: Normal heart sounds.  Pulmonary:     Effort: Pulmonary effort is normal.     Breath sounds: Normal breath sounds.  Abdominal:     General: Bowel sounds are normal.     Palpations: Abdomen is soft. There is no mass.     Tenderness: There is no abdominal tenderness.  Neurological:     Mental Status: He is alert.  Psychiatric:        Mood and Affect: Mood normal.        Behavior: Behavior normal.        Thought Content: Thought content normal.    Diabetic Foot Exam - Simple   No data filed      Lab Results  Component Value Date   WBC 5.9 04/10/2021   HGB 13.3 04/10/2021   HCT 39.1 04/10/2021   PLT 175 04/10/2021   GLUCOSE 88 07/10/2021   CHOL 135 07/10/2021   TRIG 111 07/10/2021   HDL 34 (L) 07/10/2021   LDLCALC 81 07/10/2021   ALT 22 07/10/2021   AST 29 07/10/2021   NA 137 07/10/2021   K 4.8 07/10/2021   CL 102 07/10/2021   CREATININE 1.10 07/10/2021   BUN 22 07/10/2021   CO2 22 07/10/2021   TSH 1.220 06/03/2020   INR 0.96 08/22/2010      Assessment & Plan:   Problem List Items Addressed This Visit       Cardiovascular and Mediastinum   Hypertensive heart disease without heart failure - Primary    Well controlled.  No changes to medicines.  Continue to work on eating a healthy diet and exercise.  Labs drawn today.        Coronary artery disease due to lipid rich plaque    Stable.        Musculoskeletal and Integument   Primary osteoarthritis of knee     Other   Mixed hyperlipidemia    Well controlled.  No changes to medicines.  Continue to work on eating a healthy diet and exercise.  Labs drawn today.       Other  Visit Diagnoses     BMI 27.0-27.9,adult         .  No orders of the defined types were placed in this encounter.   No orders of the defined types were placed in this encounter.   Geralynn Ochs I Leal-Borjas,acting as a scribe for Rochel Brome, MD.,have documented all relevant documentation on the behalf of Rochel Brome, MD,as directed by  Rochel Brome, MD while in the presence of Rochel Brome, MD.   Follow-up: Return in about 6 months (around 01/09/2022) for chronic follow up.  An After Visit Summary was printed and given to the patient.  Rochel Brome, MD [facility] [phone]

## 2021-07-24 DIAGNOSIS — M1710 Unilateral primary osteoarthritis, unspecified knee: Secondary | ICD-10-CM | POA: Insufficient documentation

## 2021-07-24 DIAGNOSIS — I2583 Coronary atherosclerosis due to lipid rich plaque: Secondary | ICD-10-CM | POA: Insufficient documentation

## 2021-07-24 DIAGNOSIS — I2510 Atherosclerotic heart disease of native coronary artery without angina pectoris: Secondary | ICD-10-CM | POA: Insufficient documentation

## 2021-07-24 NOTE — Assessment & Plan Note (Signed)
Well controlled.  ?No changes to medicines.  ?Continue to work on eating a healthy diet and exercise.  ?Labs drawn today.  ?

## 2021-07-24 NOTE — Assessment & Plan Note (Signed)
Stable

## 2021-07-26 ENCOUNTER — Ambulatory Visit: Payer: No Typology Code available for payment source

## 2021-07-30 ENCOUNTER — Encounter: Payer: Self-pay | Admitting: Family Medicine

## 2021-09-15 ENCOUNTER — Other Ambulatory Visit: Payer: Self-pay

## 2021-09-15 MED ORDER — ROSUVASTATIN CALCIUM 20 MG PO TABS: 20.0000 mg | ORAL_TABLET | Freq: Every day | ORAL | 1 refills | Status: DC

## 2021-09-15 MED ORDER — FENOFIBRATE 160 MG PO TABS: 160.0000 mg | ORAL_TABLET | Freq: Every day | ORAL | 1 refills | Status: DC

## 2021-09-15 MED ORDER — LISINOPRIL 40 MG PO TABS: 40.0000 mg | ORAL_TABLET | Freq: Two times a day (BID) | ORAL | 1 refills | Status: DC

## 2021-09-15 MED ORDER — CARVEDILOL 12.5 MG PO TABS: 12.5000 mg | ORAL_TABLET | Freq: Two times a day (BID) | ORAL | 1 refills | Status: DC

## 2021-09-19 ENCOUNTER — Telehealth: Payer: Self-pay

## 2021-09-19 ENCOUNTER — Other Ambulatory Visit: Payer: Self-pay

## 2021-09-19 MED ORDER — FENOFIBRATE 160 MG PO TABS: 160.0000 mg | ORAL_TABLET | Freq: Every day | ORAL | 1 refills | Status: DC

## 2021-09-19 MED ORDER — ROSUVASTATIN CALCIUM 20 MG PO TABS: 20.0000 mg | ORAL_TABLET | Freq: Every day | ORAL | 1 refills | Status: DC

## 2021-09-19 MED ORDER — CARVEDILOL 12.5 MG PO TABS: 12.5000 mg | ORAL_TABLET | Freq: Two times a day (BID) | ORAL | 1 refills | Status: DC

## 2021-09-19 MED ORDER — LISINOPRIL 40 MG PO TABS: 40.0000 mg | ORAL_TABLET | Freq: Two times a day (BID) | ORAL | 1 refills | Status: DC

## 2021-09-20 ENCOUNTER — Telehealth: Payer: Self-pay

## 2021-09-20 NOTE — Chronic Care Management (AMB) (Signed)
° ° °  Chronic Care Management Pharmacy Assistant   Name: Bryan Zimmerman  MRN: [id number] DOB: Jun 12, 1946   Reason for Encounter: Disease State call for lipids   Recent office visits:  07/12/21 Rochel Brome MD. Seen for HTN and HLD. Increased Rosuvastatin Calcium from 10 mg to 20 mg daily  Recent consult visits:  None  Hospital visits:  None  Medications: Outpatient Encounter Medications as of 09/20/2021  Medication Sig   aspirin EC 81 MG tablet Take 81 mg by mouth daily. Swallow whole.   Boswellia-Glucosamine-Vit D (OSTEO BI-FLEX ONE PER DAY PO) Take 1 tablet by mouth daily.   carvedilol (COREG) 12.5 MG tablet Take 1 tablet (12.5 mg total) by mouth 2 (two) times daily with a meal.   cetirizine (ZYRTEC) 10 MG tablet Take 10 mg by mouth daily.   co-enzyme Q-10 30 MG capsule Take 100 mg by mouth 3 (three) times daily.   fenofibrate 160 MG tablet Take 1 tablet (160 mg total) by mouth daily.   lisinopril (ZESTRIL) 40 MG tablet Take 1 tablet (40 mg total) by mouth 2 (two) times daily.   Misc Natural Products (PROSTATE SUPPORT) 300-15 MG TABS Take 1 tablet by mouth daily.   nitroGLYCERIN (NITROSTAT) 0.4 MG SL tablet Place 0.4 mg under the tongue every 5 (five) minutes as needed for chest pain.   rosuvastatin (CRESTOR) 20 MG tablet Take 1 tablet (20 mg total) by mouth daily.   No facility-administered encounter medications on file as of 09/20/2021.    Lipid Panel    Component Value Date/Time   CHOL 135 07/10/2021 1050   TRIG 111 07/10/2021 1050   HDL 34 (L) 07/10/2021 1050   LDLCALC 81 07/10/2021 1050    10-year ASCVD risk score: The ASCVD Risk score (Arnett DK, et al., 2019) failed to calculate for the following reasons:   The patient has a prior MI or stroke diagnosis  Current antihyperlipidemic regimen:  Rosuvastatin 20 mg Take 1 daily   Star Rating Drugs Medication Name Last Fill Days supply Rosuvastatin 20mg  Rosuvastatin 10mg  03/04/21 20ds  Care Gaps: Last annual wellness  visit?   I have made several attempts trying to complete this call

## 2021-10-17 ENCOUNTER — Other Ambulatory Visit: Payer: Self-pay

## 2021-10-17 ENCOUNTER — Encounter: Payer: Self-pay | Admitting: Family Medicine

## 2021-10-17 ENCOUNTER — Ambulatory Visit (INDEPENDENT_AMBULATORY_CARE_PROVIDER_SITE_OTHER): Payer: Medicare Other | Admitting: Family Medicine

## 2021-10-17 ENCOUNTER — Other Ambulatory Visit: Payer: Self-pay | Admitting: Family Medicine

## 2021-10-17 VITALS — BP 172/62 | HR 58 | Temp 98.3°F | Ht 69.0 in | Wt 190.0 lb

## 2021-10-17 DIAGNOSIS — G8929 Other chronic pain: Secondary | ICD-10-CM | POA: Insufficient documentation

## 2021-10-17 DIAGNOSIS — M542 Cervicalgia: Secondary | ICD-10-CM | POA: Diagnosis not present

## 2021-10-17 DIAGNOSIS — I119 Hypertensive heart disease without heart failure: Secondary | ICD-10-CM

## 2021-10-17 DIAGNOSIS — M25511 Pain in right shoulder: Secondary | ICD-10-CM | POA: Diagnosis not present

## 2021-10-17 DIAGNOSIS — M25512 Pain in left shoulder: Secondary | ICD-10-CM | POA: Diagnosis not present

## 2021-10-17 DIAGNOSIS — E782 Mixed hyperlipidemia: Secondary | ICD-10-CM

## 2021-10-17 LAB — TROPONIN T: Troponin T (Highly Sensitive): 12 ng/L (ref 0–22)

## 2021-10-17 MED ORDER — CYCLOBENZAPRINE HCL 5 MG PO TABS: 5.0000 mg | ORAL_TABLET | Freq: Three times a day (TID) | ORAL | 1 refills | Status: DC | PRN

## 2021-10-17 MED ORDER — AMLODIPINE BESYLATE 5 MG PO TABS: 5.0000 mg | ORAL_TABLET | Freq: Every day | ORAL | 0 refills | Status: DC

## 2021-10-17 MED ORDER — NAPROXEN 500 MG PO TABS: 500.0000 mg | ORAL_TABLET | Freq: Two times a day (BID) | ORAL | 0 refills | Status: DC

## 2021-10-17 NOTE — Assessment & Plan Note (Signed)
stop ibuprofen. Start on naproxen 500 mg twice daily.  Cyclobenzaprine 5 mg 3 times a day for muscle spasms. Ordered cspine xray.

## 2021-10-17 NOTE — Assessment & Plan Note (Signed)
Naproxen rx given.

## 2021-10-17 NOTE — Patient Instructions (Addendum)
Thoracic back pain/arm pain  stop ibuprofen.  Start on naproxen 500 mg twice daily.  Cyclobenzaprine 5 mg 3 times a day for muscle spasms.  Hypertension: Started on amlodipine 5 mg once daily. Decreased carvedilol 12.5 mg 1/2 pill twice a day. Continue other blood pressure medicines. Checking labs.

## 2021-10-17 NOTE — Progress Notes (Signed)
Subjective:  Patient ID: Bryan Zimmerman, male    DOB: 1945-10-25  Age: 76 y.o. MRN: [id number]  Chief Complaint  Patient presents with   Bilateral Shoulder Pain x 2 weeks   Hypertension   HPI: Patient complaining of BL shoulder pain, thoracic back pain, and neck pain for 2 weeks. Started after digging a hole for his dog of 16 years passed. His right arm hurts also particularly at night. Patient has history of CORONARY ARTERY DISEASE and he was concerned as it was in his arm. Patient has been taking ibuprofen 200 mg 2 pills three times a day as well as tylenol.   Patient has noticed his bp has increased over these last 2 weeks. BP log shows bps 150-190/70-90. Pulse 54-64. Patient is on carvedilol 12.5 mg twice daily and lisinopril 40 mg twice daily. Denies chest pain, dypsnea, or headache.   Current Outpatient Medications on File Prior to Visit  Medication Sig Dispense Refill   aspirin EC 81 MG tablet Take 81 mg by mouth daily. Swallow whole.     Boswellia-Glucosamine-Vit D (OSTEO BI-FLEX ONE PER DAY PO) Take 1 tablet by mouth daily.     carvedilol (COREG) 12.5 MG tablet Take 1 tablet (12.5 mg total) by mouth 2 (two) times daily with a meal. 180 tablet 1   cetirizine (ZYRTEC) 10 MG tablet Take 10 mg by mouth daily.     co-enzyme Q-10 30 MG capsule Take 100 mg by mouth 3 (three) times daily.     fenofibrate 160 MG tablet Take 1 tablet (160 mg total) by mouth daily. 90 tablet 1   lisinopril (ZESTRIL) 40 MG tablet Take 1 tablet (40 mg total) by mouth 2 (two) times daily. 180 tablet 1   Misc Natural Products (PROSTATE SUPPORT) 300-15 MG TABS Take 1 tablet by mouth daily.     nitroGLYCERIN (NITROSTAT) 0.4 MG SL tablet Place 0.4 mg under the tongue every 5 (five) minutes as needed for chest pain.     rosuvastatin (CRESTOR) 20 MG tablet Take 1 tablet (20 mg total) by mouth daily. 90 tablet 1   No current facility-administered medications on file prior to visit.   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      Family History  Problem Relation Age of Onset   Dementia Mother    Cancer Father        pancreatic   Hypertension Other    Hyperlipidemia Other    Social History   Socioeconomic History   Marital status: Married    Spouse name: [name]   Number of children: Not on file   Years of education: Not on file   Highest education level: Not on file  Occupational History   Not on file  Tobacco Use   Smoking status: Former    Types: Cigarettes   Smokeless tobacco: Never  Vaping Use   Vaping Use: Never used  Substance and Sexual Activity   Alcohol use: Never   Drug use: Never   Sexual activity: Not on file  Other Topics  Concern   Not on file  Social History Narrative   Not on file   Social Determinants of Health   Financial Resource Strain: Not on file  Food Insecurity: Not on file  Transportation Needs: Not on file  Physical Activity: Not on file  Stress: Not on file  Social Connections: Not on file    Review of Systems  Constitutional:  Negative for chills, fatigue and fever.  HENT:  Negative for congestion, ear pain and sore throat.   Respiratory:  Negative for cough and shortness of breath.   Cardiovascular:  Negative for chest pain and leg swelling.  Gastrointestinal:  Negative for abdominal pain, constipation, diarrhea, nausea and vomiting.  Genitourinary:  Negative for dysuria and frequency.  Musculoskeletal:  Positive for myalgias (Bilateral shoulder pain with radiation.) and neck stiffness. Negative for arthralgias.  Neurological:  Negative for dizziness and headaches.  Psychiatric/Behavioral:   Negative for dysphoric mood. The patient is not nervous/anxious.    OF NOTE, WHEN PATIENT LAID DOWN TO HAVE  EKG HE HAD A SHOOTING PAIN FROM HIS NECK DOWN TO THE TIP OF HIS FINGERS.   Objective:  BP (!) 172/62 (BP Location: Right Arm, Patient Position: Sitting)    Pulse (!) 58    Temp 98.3 F (36.8 C) (Oral)    Ht 5\' 9"  (1.753 m)    Wt 190 lb (86.2 kg)    SpO2 96%    BMI 28.06 kg/m   BP/Weight 10/17/2021 07/12/2021 03/26/5783  Systolic BP 696 295 284  Diastolic BP 62 60 68  Wt. (Lbs) 190 187 189  BMI 28.06 27.62 27.91    Physical Exam Vitals reviewed.  Constitutional:      Appearance: Normal appearance. He is normal weight.  Cardiovascular:     Rate and Rhythm: Normal rate and regular rhythm.     Heart sounds: Normal heart sounds.  Pulmonary:     Effort: Pulmonary effort is normal.     Breath sounds: Normal breath sounds.  Musculoskeletal:        General: Tenderness (thoracic paraspinal muscles. tender over trapezius muscles [email or web address] tenderness distal rt upper arm. nontender over lateral epidondyle.) present. Normal range of motion.     Comments: Discomfort with right rotation of head. Left rotation normal.  Neurological:     Mental Status: He is alert and oriented to person, place, and time.  Psychiatric:        Mood and Affect: Mood normal.        Behavior: Behavior normal.    Diabetic Foot Exam - Simple   No data filed      Lab Results  Component Value Date   WBC 5.9 04/10/2021   HGB 13.3 04/10/2021   HCT 39.1 04/10/2021   PLT 175 04/10/2021   GLUCOSE 88 07/10/2021   CHOL 135 07/10/2021   TRIG 111 07/10/2021   HDL 34 (L) 07/10/2021   LDLCALC 81 07/10/2021   ALT 22 07/10/2021   AST 29 07/10/2021   NA 137 07/10/2021   K 4.8 07/10/2021   CL 102 07/10/2021   CREATININE 1.10 07/10/2021   BUN 22 07/10/2021   CO2 22 07/10/2021   TSH 1.220 06/03/2020   INR 0.96 08/22/2010      Assessment & Plan:   Problem List Items Addressed This Visit        Cardiovascular and Mediastinum   Hypertensive heart disease without heart failure    EKG Sinus bradycardia. Started on amlodipine 5 mg once daily. Decreased carvedilol 12.5  mg 1/2 pill twice a day. Continue lisinopril 40 mg twice daily.  Checking labs. Troponin T stat.       Relevant Medications   amLODipine (NORVASC) 5 MG tablet   Other Relevant Orders   Comprehensive metabolic panel   CBC with Differential/Platelet   TSH   EKG 12-Lead (Completed)     Other   Acute pain of both shoulders - Primary    Naproxen rx given.      Relevant Medications   cyclobenzaprine (FLEXERIL) 5 MG tablet   Cervical pain (neck)    stop ibuprofen. Start on naproxen 500 mg twice daily.  Cyclobenzaprine 5 mg 3 times a day for muscle spasms. Ordered cspine xray.      Relevant Medications   cyclobenzaprine (FLEXERIL) 5 MG tablet   naproxen (NAPROSYN) 500 MG tablet   Other Relevant Orders   DG Cervical Spine Complete   Mixed hyperlipidemia   Relevant Medications   amLODipine (NORVASC) 5 MG tablet   Other Relevant Orders   TSH  .  Meds ordered this encounter  Medications   cyclobenzaprine (FLEXERIL) 5 MG tablet    Sig: Take 1 tablet (5 mg total) by mouth 3 (three) times daily as needed for muscle spasms.    Dispense:  30 tablet    Refill:  1   naproxen (NAPROSYN) 500 MG tablet    Sig: Take 1 tablet (500 mg total) by mouth 2 (two) times daily with a meal.    Dispense:  30 tablet    Refill:  0   amLODipine (NORVASC) 5 MG tablet    Sig: Take 1 tablet (5 mg total) by mouth daily.    Dispense:  30 tablet    Refill:  0    Orders Placed This Encounter  Procedures   DG Cervical Spine Complete   Comprehensive metabolic panel   CBC with Differential/Platelet   TSH   EKG 12-Lead    Total time spent on today's visit was greater than 30 minutes, including both face-to-face time and nonface-to-face time personally spent on review of chart (labs and imaging), discussing labs, discussing further  work-up, treatment options, answering patient's questions, and coordinating care.  Follow-up: Return in about 3 weeks (around 11/07/2021).  An After Visit Summary was printed and given to the patient.   I,Lauren M Auman,acting as a scribe for Rochel Brome, MD.,have documented all relevant documentation on the behalf of Rochel Brome, MD,as directed by  Rochel Brome, MD while in the presence of Rochel Brome, MD.   Rochel Brome, MD [place] [phone]

## 2021-10-17 NOTE — Assessment & Plan Note (Addendum)
EKG Sinus bradycardia. Started on amlodipine 5 mg once daily. Decreased carvedilol 12.5 mg 1/2 pill twice a day. Continue lisinopril 40 mg twice daily.  Checking labs. Troponin T stat.

## 2021-10-18 LAB — COMPREHENSIVE METABOLIC PANEL
ALT: 26 IU/L (ref 0–44)
AST: 29 IU/L (ref 0–40)
Albumin/Globulin Ratio: 2.1 (ref 1.2–2.2)
Albumin: 4.6 g/dL (ref 3.7–4.7)
Alkaline Phosphatase: 48 IU/L (ref 44–121)
BUN/Creatinine Ratio: 20 (ref 10–24)
BUN: 21 mg/dL (ref 8–27)
Bilirubin Total: 0.3 mg/dL (ref 0.0–1.2)
CO2: 22 mmol/L (ref 20–29)
Calcium: 9.9 mg/dL (ref 8.6–10.2)
Chloride: 103 mmol/L (ref 96–106)
Creatinine, Ser: 1.06 mg/dL (ref 0.76–1.27)
Globulin, Total: 2.2 g/dL (ref 1.5–4.5)
Glucose: 85 mg/dL (ref 70–99)
Potassium: 4.9 mmol/L (ref 3.5–5.2)
Sodium: 139 mmol/L (ref 134–144)
Total Protein: 6.8 g/dL (ref 6.0–8.5)
eGFR: 72 mL/min/{1.73_m2} (ref >59)

## 2021-10-18 LAB — CBC WITH DIFFERENTIAL/PLATELET
Basophils Absolute: 0.1 10*3/uL (ref 0.0–0.2)
Basos: 1 %
EOS (ABSOLUTE): 0.6 10*3/uL — ABNORMAL HIGH (ref 0.0–0.4)
Eos: 7 %
Hematocrit: 43.8 % (ref 37.5–51.0)
Hemoglobin: 14.6 g/dL (ref 13.0–17.7)
Immature Grans (Abs): 0 10*3/uL (ref 0.0–0.1)
Immature Granulocytes: 0 %
Lymphocytes Absolute: 1.4 10*3/uL (ref 0.7–3.1)
Lymphs: 18 %
MCH: 29.4 pg (ref 26.6–33.0)
MCHC: 33.3 g/dL (ref 31.5–35.7)
MCV: 88 fL (ref 79–97)
Monocytes Absolute: 0.6 10*3/uL (ref 0.1–0.9)
Monocytes: 8 %
Neutrophils Absolute: 5 10*3/uL (ref 1.4–7.0)
Neutrophils: 66 %
Platelets: 205 10*3/uL (ref 150–450)
RBC: 4.96 x10E6/uL (ref 4.14–5.80)
RDW: 13.1 % (ref 11.6–15.4)
WBC: 7.6 10*3/uL (ref 3.4–10.8)

## 2021-10-18 LAB — TSH: TSH: 1.95 u[IU]/mL (ref 0.450–4.500)

## 2021-10-23 ENCOUNTER — Other Ambulatory Visit: Payer: Self-pay

## 2021-10-23 DIAGNOSIS — M542 Cervicalgia: Secondary | ICD-10-CM

## 2021-10-23 DIAGNOSIS — M810 Age-related osteoporosis without current pathological fracture: Secondary | ICD-10-CM

## 2021-10-24 ENCOUNTER — Telehealth: Payer: Self-pay | Admitting: Family Medicine

## 2021-10-24 NOTE — Telephone Encounter (Signed)
? ?  Jahshua Biggar has been scheduled for the following appointment: ? ?WHAT: CERVICAL MRI ?WHERE: [facility] ?DATE: 10/26/21 ?TIME: 1:15 PM ARRIVAL TIME ? ?Patient has been made aware. ? ? ? ? ?Emilian Winders has been scheduled for the following appointment: ? ?WHAT: BONE DENSITY ?WHERE: [place] ?DATE: 01/03/22  ?TIME: 10:00 AM ARRIVAL TIME ? ?Patient has been made aware. ? ?

## 2021-10-26 DIAGNOSIS — M2578 Osteophyte, vertebrae: Secondary | ICD-10-CM | POA: Diagnosis not present

## 2021-10-26 DIAGNOSIS — Z135 Encounter for screening for eye and ear disorders: Secondary | ICD-10-CM | POA: Diagnosis not present

## 2021-10-26 DIAGNOSIS — M542 Cervicalgia: Secondary | ICD-10-CM | POA: Diagnosis not present

## 2021-10-26 DIAGNOSIS — R200 Anesthesia of skin: Secondary | ICD-10-CM | POA: Diagnosis not present

## 2021-10-27 ENCOUNTER — Other Ambulatory Visit: Payer: Self-pay

## 2021-10-27 DIAGNOSIS — M542 Cervicalgia: Secondary | ICD-10-CM

## 2021-11-02 DIAGNOSIS — M542 Cervicalgia: Secondary | ICD-10-CM | POA: Diagnosis not present

## 2021-11-02 DIAGNOSIS — M5412 Radiculopathy, cervical region: Secondary | ICD-10-CM | POA: Diagnosis not present

## 2021-11-06 NOTE — Progress Notes (Signed)
? ?Subjective:  ?Patient ID: Bryan Zimmerman, male    DOB: [birth year]  Age: 76 y.o. MRN: [id number] ? ?Chief Complaint  ?Patient presents with  ? Neck pain/ Bilateral Shoulder pain  ?  3 week follow up  ? ?HPI ?Neck pain/Right shoulder.  ?Physical therapy: to start in am. Returns April 27th with Dr. Saintclair Halsted.  ?Tip of right index finger was numb and has improved.  ?Give naproxen and flexeril. This helped. No medicines in 4-5 days.  ? ?HYPERTENSION: 120-160s/ 57-114. Pulse 58-70s. Decreased carvedilol 12.5 mg 1/2 twice daily and lisinopril 40 mg twice daily.   Started amlodipine 5 mg once daily at his last visit.  ? ?Current Outpatient Medications on File Prior to Visit  ?Medication Sig Dispense Refill  ? aspirin EC 81 MG tablet Take 81 mg by mouth daily. Swallow whole.    ? Boswellia-Glucosamine-Vit D (OSTEO BI-FLEX ONE PER DAY PO) Take 1 tablet by mouth daily.    ? cetirizine (ZYRTEC) 10 MG tablet Take 10 mg by mouth daily.    ? co-enzyme Q-10 30 MG capsule Take 100 mg by mouth 3 (three) times daily.    ? cyclobenzaprine (FLEXERIL) 5 MG tablet Take 1 tablet (5 mg total) by mouth 3 (three) times daily as needed for muscle spasms. 30 tablet 1  ? fenofibrate 160 MG tablet Take 1 tablet (160 mg total) by mouth daily. 90 tablet 1  ? lisinopril (ZESTRIL) 40 MG tablet Take 1 tablet (40 mg total) by mouth 2 (two) times daily. 180 tablet 1  ? Misc Natural Products (PROSTATE SUPPORT) 300-15 MG TABS Take 1 tablet by mouth daily.    ? nitroGLYCERIN (NITROSTAT) 0.4 MG SL tablet Place 0.4 mg under the tongue every 5 (five) minutes as needed for chest pain.    ? rosuvastatin (CRESTOR) 20 MG tablet Take 1 tablet (20 mg total) by mouth daily. 90 tablet 1  ? ?No current facility-administered medications on file prior to visit.  ? ?Past Medical History:  ?Diagnosis Date  ? CAD (coronary artery disease)   ? History of heart attack   ? History of kidney stones   ? Hypertensive heart disease without heart failure   ? Mixed hyperlipidemia   ?  Osteoarthritis   ? ?Past Surgical History:  ?Procedure Laterality Date  ? CATARACT EXTRACTION, BILATERAL    ? HERNIA REPAIR    ? ptca    ? PTCA    ? 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  ?Family History  ?Problem Relation Age of Onset  ? Dementia Mother   ? Cancer Father   ?     pancreatic  ? Hypertension Other   ? Hyperlipidemia Other   ? ?Social History  ? ?Socioeconomic History  ? Marital status: Married  ?  Spouse name: [name]  ? Number of children: Not on file  ? Years of education: Not on file  ? Highest education level: Not on file  ?Occupational History  ? Not on file  ?Tobacco Use  ? Smoking status: Former  ?  Types: Cigarettes  ? Smokeless tobacco: Never  ?Vaping Use  ? Vaping Use: Never used  ?Substance and Sexual Activity  ? Alcohol use: Never  ? Drug use: Never  ? Sexual activity: Not on file  ?Other Topics Concern  ? Not on file  ?Social History Narrative  ? Not on file  ? ?Social Determinants of Health  ? ?Financial Resource Strain: Not on file  ?Food Insecurity: Not on  file  ?Transportation Needs: Not on file  ?Physical Activity: Not on file  ?Stress: Not on file  ?Social Connections: Not on file  ? ? ?Review of Systems  ?Constitutional:  Negative for appetite change, fatigue and fever.  ?HENT:  Negative for congestion, ear pain, sinus pressure and sore throat.   ?Respiratory:  Negative for cough, chest tightness, shortness of breath and wheezing.   ?Cardiovascular:  Negative for chest pain and palpitations.  ?Gastrointestinal:  Negative for abdominal pain, constipation, diarrhea, nausea and vomiting.  ?Genitourinary:  Negative for dysuria and hematuria.  ?Musculoskeletal:  Negative for arthralgias, back pain, joint swelling and myalgias.  ?Skin:  Negative for rash.  ?Neurological:  Negative for dizziness, weakness and headaches.  ?Psychiatric/Behavioral:  Negative for dysphoric mood. The patient is not nervous/anxious.   ? ? ?Objective:  ?BP (!) 150/68   Pulse 72   Temp 97.6 ?F (36.4 ?C)    Resp 18   Ht '5\' 9"'$  (1.753 m)   Wt 189 lb (85.7 kg)   BMI 27.91 kg/m?  ? ? ?  11/07/2021  ? 10:09 AM 10/17/2021  ? 11:02 AM 07/12/2021  ?  8:22 AM  ?BP/Weight  ?Systolic BP 379 024 097  ?Diastolic BP 68 62 60  ?Wt. (Lbs) 189 190 187  ?BMI 27.91 kg/m2 28.06 kg/m2 27.62 kg/m2  ? ? ?Physical Exam ?Vitals reviewed.  ?Constitutional:   ?   Appearance: Normal appearance. He is normal weight.  ?Cardiovascular:  ?   Rate and Rhythm: Normal rate and regular rhythm.  ?   Heart sounds: Normal heart sounds.  ?Pulmonary:  ?   Effort: Pulmonary effort is normal.  ?   Breath sounds: Normal breath sounds.  ?Musculoskeletal:  ?   Comments: FROM of BL shoulders. Right shoulder nontender.   ?Neurological:  ?   Mental Status: He is alert and oriented to person, place, and time.  ?Psychiatric:     ?   Mood and Affect: Mood normal.     ?   Behavior: Behavior normal.  ? ? ?Diabetic Foot Exam - Simple   ?No data filed ?  ?  ? ?Lab Results  ?Component Value Date  ? WBC 7.6 10/17/2021  ? HGB 14.6 10/17/2021  ? HCT 43.8 10/17/2021  ? PLT 205 10/17/2021  ? GLUCOSE 85 10/17/2021  ? CHOL 135 07/10/2021  ? TRIG 111 07/10/2021  ? HDL 34 (L) 07/10/2021  ? [place] 81 07/10/2021  ? ALT 26 10/17/2021  ? AST 29 10/17/2021  ? NA 139 10/17/2021  ? K 4.9 10/17/2021  ? CL 103 10/17/2021  ? CREATININE 1.06 10/17/2021  ? BUN 21 10/17/2021  ? CO2 22 10/17/2021  ? TSH 1.950 10/17/2021  ? INR 0.96 08/22/2010  ? ? ? ? ?Assessment & Plan:  ? ?Problem List Items Addressed This Visit   ? ?  ? Cardiovascular and Mediastinum  ? Hypertensive heart disease without heart failure  ?  Improved, but not at goal.  ?Bradycardia resolved.  ?Increase amlodipine 10 mg once daily.  ?Continue lisinopril 40 mg twice daily  ?Continue carvedilol at 6.25 mg one twice daily.  ?  ?  ? Relevant Medications  ? amLODipine (NORVASC) 10 MG tablet  ? carvedilol (COREG) 6.25 MG tablet  ?  ? Other  ? Chronic right shoulder pain - Primary  ?  Improved.  ?Try otc aleve twice daily . ?Proceed  with Physical therapy.  ? ? ?  ?  ? Cervical pain (neck)  ?  Improved.  ?Try otc aleve twice daily . ?Proceed with Physical therapy.  ?  ?  ?. ? ?Meds ordered this encounter  ?Medications  ? amLODipine (NORVASC) 10 MG tablet  ?  Sig: Take 1 tablet (10 mg total) by mouth daily.  ?  Dispense:  90 tablet  ?  Refill:  1  ? carvedilol (COREG) 6.25 MG tablet  ?  Sig: Take 1 tablet (6.25 mg total) by mouth 2 (two) times daily with a meal.  ?  Dispense:  180 tablet  ?  Refill:  1  ? ? ?No orders of the defined types were placed in this encounter. ?  ? ?Follow-up: Return for previously scheduled appt. . ? ?An After Visit Summary was printed and given to the patient. ? ? ?I,Lauren M Auman,acting as a scribe for Rochel Brome, MD.,have documented all relevant documentation on the behalf of Rochel Brome, MD,as directed by  Rochel Brome, MD while in the presence of Rochel Brome, MD.  ? ? ?Rochel Brome, MD ?[place] ?[phone] ?

## 2021-11-07 ENCOUNTER — Ambulatory Visit (INDEPENDENT_AMBULATORY_CARE_PROVIDER_SITE_OTHER): Payer: Medicare Other | Admitting: Family Medicine

## 2021-11-07 ENCOUNTER — Other Ambulatory Visit: Payer: Self-pay

## 2021-11-07 VITALS — BP 150/68 | HR 72 | Temp 97.6°F | Resp 18 | Ht 69.0 in | Wt 189.0 lb

## 2021-11-07 DIAGNOSIS — M25511 Pain in right shoulder: Secondary | ICD-10-CM | POA: Diagnosis not present

## 2021-11-07 DIAGNOSIS — I119 Hypertensive heart disease without heart failure: Secondary | ICD-10-CM | POA: Diagnosis not present

## 2021-11-07 DIAGNOSIS — G8929 Other chronic pain: Secondary | ICD-10-CM

## 2021-11-07 DIAGNOSIS — M542 Cervicalgia: Secondary | ICD-10-CM | POA: Diagnosis not present

## 2021-11-07 MED ORDER — CARVEDILOL 6.25 MG PO TABS: 6.2500 mg | ORAL_TABLET | Freq: Two times a day (BID) | ORAL | 1 refills | Status: DC

## 2021-11-07 MED ORDER — AMLODIPINE BESYLATE 10 MG PO TABS: 10.0000 mg | ORAL_TABLET | Freq: Every day | ORAL | 1 refills | Status: DC

## 2021-11-07 NOTE — Patient Instructions (Addendum)
Increase amlodipine 10 mg once daily.  ?Continue lisinopril 40 mg twice daily  ?Continue carvedilol at 6.25 mg one twice daily.  ?

## 2021-11-08 DIAGNOSIS — M4003 Postural kyphosis, cervicothoracic region: Secondary | ICD-10-CM | POA: Diagnosis not present

## 2021-11-08 DIAGNOSIS — M5412 Radiculopathy, cervical region: Secondary | ICD-10-CM | POA: Diagnosis not present

## 2021-11-10 ENCOUNTER — Encounter: Payer: Self-pay | Admitting: Family Medicine

## 2021-11-10 DIAGNOSIS — M5412 Radiculopathy, cervical region: Secondary | ICD-10-CM | POA: Diagnosis not present

## 2021-11-10 DIAGNOSIS — M4003 Postural kyphosis, cervicothoracic region: Secondary | ICD-10-CM | POA: Diagnosis not present

## 2021-11-10 NOTE — Assessment & Plan Note (Addendum)
Improved.  ?Try otc aleve twice daily . ?Proceed with Physical therapy.  ?

## 2021-11-10 NOTE — Assessment & Plan Note (Addendum)
Improved, but not at goal.  ?Bradycardia resolved.  ?Increase amlodipine 10 mg once daily.  ?Continue lisinopril 40 mg twice daily  ?Continue carvedilol at 6.25 mg one twice daily.  ?

## 2021-11-10 NOTE — Assessment & Plan Note (Addendum)
Improved.  ?Try otc aleve twice daily . ?Proceed with Physical therapy.  ? ? ?

## 2021-11-20 DIAGNOSIS — M4003 Postural kyphosis, cervicothoracic region: Secondary | ICD-10-CM | POA: Diagnosis not present

## 2021-11-20 DIAGNOSIS — M5412 Radiculopathy, cervical region: Secondary | ICD-10-CM | POA: Diagnosis not present

## 2021-11-23 DIAGNOSIS — M5412 Radiculopathy, cervical region: Secondary | ICD-10-CM | POA: Diagnosis not present

## 2021-11-23 DIAGNOSIS — M4003 Postural kyphosis, cervicothoracic region: Secondary | ICD-10-CM | POA: Diagnosis not present

## 2021-11-28 DIAGNOSIS — M4003 Postural kyphosis, cervicothoracic region: Secondary | ICD-10-CM | POA: Diagnosis not present

## 2021-11-28 DIAGNOSIS — M5412 Radiculopathy, cervical region: Secondary | ICD-10-CM | POA: Diagnosis not present

## 2021-11-30 DIAGNOSIS — M4003 Postural kyphosis, cervicothoracic region: Secondary | ICD-10-CM | POA: Diagnosis not present

## 2021-11-30 DIAGNOSIS — M5412 Radiculopathy, cervical region: Secondary | ICD-10-CM | POA: Diagnosis not present

## 2021-12-07 DIAGNOSIS — M4003 Postural kyphosis, cervicothoracic region: Secondary | ICD-10-CM | POA: Diagnosis not present

## 2021-12-07 DIAGNOSIS — M5412 Radiculopathy, cervical region: Secondary | ICD-10-CM | POA: Diagnosis not present

## 2022-01-03 DIAGNOSIS — Z13820 Encounter for screening for osteoporosis: Secondary | ICD-10-CM | POA: Diagnosis not present

## 2022-01-03 DIAGNOSIS — M810 Age-related osteoporosis without current pathological fracture: Secondary | ICD-10-CM | POA: Diagnosis not present

## 2022-01-08 ENCOUNTER — Other Ambulatory Visit: Payer: Medicare Other

## 2022-01-08 DIAGNOSIS — I119 Hypertensive heart disease without heart failure: Secondary | ICD-10-CM

## 2022-01-08 DIAGNOSIS — E782 Mixed hyperlipidemia: Secondary | ICD-10-CM

## 2022-01-09 LAB — HM DEXA SCAN: HM Dexa Scan: NEGATIVE

## 2022-01-10 ENCOUNTER — Ambulatory Visit: Payer: No Typology Code available for payment source | Admitting: Family Medicine

## 2022-01-11 LAB — CBC WITH DIFF/PLATELET
Basophils Absolute: 0.1 10*3/uL (ref 0.0–0.2)
Basos: 1 %
EOS (ABSOLUTE): 0.4 10*3/uL (ref 0.0–0.4)
Eos: 7 %
Hematocrit: 42.5 % (ref 37.5–51.0)
Hemoglobin: 14 g/dL (ref 13.0–17.7)
Immature Grans (Abs): 0.1 10*3/uL (ref 0.0–0.1)
Immature Granulocytes: 1 %
Lymphocytes Absolute: 1.3 10*3/uL (ref 0.7–3.1)
Lymphs: 20 %
MCH: 29.4 pg (ref 26.6–33.0)
MCHC: 32.9 g/dL (ref 31.5–35.7)
MCV: 89 fL (ref 79–97)
Monocytes Absolute: 0.5 10*3/uL (ref 0.1–0.9)
Monocytes: 8 %
Neutrophils Absolute: 4.3 10*3/uL (ref 1.4–7.0)
Neutrophils: 63 %
Platelets: 215 10*3/uL (ref 150–450)
RBC: 4.76 x10E6/uL (ref 4.14–5.80)
RDW: 12.9 % (ref 11.6–15.4)
WBC: 6.7 10*3/uL (ref 3.4–10.8)

## 2022-01-11 LAB — COMPREHENSIVE METABOLIC PANEL
ALT: 29 IU/L (ref 0–44)
AST: 31 IU/L (ref 0–40)
Albumin/Globulin Ratio: 2 (ref 1.2–2.2)
Albumin: 4.6 g/dL (ref 3.7–4.7)
Alkaline Phosphatase: 49 IU/L (ref 44–121)
BUN/Creatinine Ratio: 16 (ref 10–24)
BUN: 18 mg/dL (ref 8–27)
Bilirubin Total: 0.3 mg/dL (ref 0.0–1.2)
CO2: 18 mmol/L — ABNORMAL LOW (ref 20–29)
Calcium: 9.5 mg/dL (ref 8.6–10.2)
Chloride: 103 mmol/L (ref 96–106)
Creatinine, Ser: 1.11 mg/dL (ref 0.76–1.27)
Globulin, Total: 2.3 g/dL (ref 1.5–4.5)
Glucose: 87 mg/dL (ref 70–99)
Potassium: 5 mmol/L (ref 3.5–5.2)
Sodium: 138 mmol/L (ref 134–144)
Total Protein: 6.9 g/dL (ref 6.0–8.5)
eGFR: 68 mL/min/{1.73_m2} (ref >59)

## 2022-01-11 LAB — LIPID PANEL
Chol/HDL Ratio: 4.3 ratio (ref 0.0–5.0)
Cholesterol, Total: 151 mg/dL (ref 100–199)
HDL: 35 mg/dL — ABNORMAL LOW (ref >39)
LDL Chol Calc (NIH): 93 mg/dL (ref 0–99)
Triglycerides: 127 mg/dL (ref 0–149)
VLDL Cholesterol Cal: 23 mg/dL (ref 5–40)

## 2022-01-11 LAB — CARDIOVASCULAR RISK ASSESSMENT

## 2022-02-15 NOTE — Progress Notes (Unsigned)
Subjective:  Patient ID: Bryan Zimmerman, male    DOB: 04-02-1946  Age: 76 y.o. MRN: [id number]  Chief Complaint  Patient presents with   Hyperlipidemia   Hypertension   Coronary Artery Disease   HPI Hypertensive heart disease: Patient is taking Amlodipine 10 mg daily, Aspirin 81 mg daily, Carvedilol 6.25 mg twice a day, Lisinopril 40 mg twice a day.  Hyperlipidemia: He takes Rosuvastatin 20 mg daily, CoQ10 100 mg TID, Fenofibrate 160 mg daily.  Low fat diet and active in yard work.   Current Outpatient Medications on File Prior to Visit  Medication Sig Dispense Refill   amLODipine (NORVASC) 10 MG tablet Take 1 tablet (10 mg total) by mouth daily. 90 tablet 1   aspirin EC 81 MG tablet Take 81 mg by mouth daily. Swallow whole.     Boswellia-Glucosamine-Vit D (OSTEO BI-FLEX ONE PER DAY PO) Take 1 tablet by mouth daily.     carvedilol (COREG) 6.25 MG tablet Take 1 tablet (6.25 mg total) by mouth 2 (two) times daily with a meal. 180 tablet 1   cetirizine (ZYRTEC) 10 MG tablet Take 10 mg by mouth daily.     co-enzyme Q-10 30 MG capsule Take 100 mg by mouth 3 (three) times daily.     fenofibrate 160 MG tablet Take 1 tablet (160 mg total) by mouth daily. 90 tablet 1   lisinopril (ZESTRIL) 40 MG tablet Take 1 tablet (40 mg total) by mouth 2 (two) times daily. 180 tablet 1   Misc Natural Products (PROSTATE SUPPORT) 300-15 MG TABS Take 1 tablet by mouth daily.     nitroGLYCERIN (NITROSTAT) 0.4 MG SL tablet Place 0.4 mg under the tongue every 5 (five) minutes as needed for chest pain.     No current facility-administered medications on file prior to visit.   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      Family History  Problem Relation Age of Onset   Dementia Mother    Cancer Father        pancreatic   Hypertension Other    Hyperlipidemia Other    Social History   Socioeconomic History   Marital status: Married    Spouse name: [name]   Number of children: Not on file   Years of education: Not on file   Highest education level: Not on file  Occupational History   Not on file  Tobacco Use   Smoking status: Former    Types: Cigarettes   Smokeless tobacco: Never  Vaping Use   Vaping Use: Never used  Substance and Sexual Activity   Alcohol use: Never   Drug use: Never   Sexual activity: Not on file  Other Topics Concern   Not on file  Social History Narrative   Not on file   Social Determinants of Health   Financial Resource Strain: Not on file  Food Insecurity: No Food Insecurity (09/14/2020)   Hunger Vital Sign    Worried About Running Out of Food in the Last Year: Never true  Ran Out of Food in the Last Year: Never true  Transportation Needs: No Transportation Needs (09/14/2020)   PRAPARE - Hydrologist (Medical): No    Lack of Transportation (Non-Medical): No  Physical Activity: Not on file  Stress: Not on file  Social Connections: Not on file    Review of Systems  Constitutional:  Negative for chills, fatigue, fever and unexpected weight change.  HENT:  Negative for congestion, ear pain, sinus pain and sore throat.   Respiratory:  Negative for cough and shortness of breath.   Cardiovascular:  Negative for chest pain and palpitations.  Gastrointestinal:  Negative for abdominal pain, blood in stool, constipation, diarrhea, nausea and vomiting.  Endocrine: Negative for polydipsia.  Genitourinary:  Negative for dysuria.  Musculoskeletal:  Negative for back pain.  Skin:  Negative for rash.  Neurological:  Negative for headaches.     Objective:  BP 120/60   Pulse 65    Temp 98 F (36.7 C)   Resp 15   Ht '5\' 9"'$  (1.753 m)   Wt 192 lb (87.1 kg)   SpO2 96%   BMI 28.35 kg/m      02/16/2022    8:04 AM 11/07/2021   10:09 AM 10/17/2021   11:02 AM  BP/Weight  Systolic BP 381 017 510  Diastolic BP 60 68 62  Wt. (Lbs) 192 189 190  BMI 28.35 kg/m2 27.91 kg/m2 28.06 kg/m2    Physical Exam Vitals reviewed.  Constitutional:      Appearance: Normal appearance.  Neck:     Vascular: No carotid bruit.  Cardiovascular:     Rate and Rhythm: Normal rate and regular rhythm.     Heart sounds: Normal heart sounds.  Pulmonary:     Effort: Pulmonary effort is normal.     Breath sounds: Normal breath sounds. No wheezing, rhonchi or rales.  Abdominal:     General: Bowel sounds are normal.     Palpations: Abdomen is soft.     Tenderness: There is no abdominal tenderness.  Neurological:     Mental Status: He is alert and oriented to person, place, and time.  Psychiatric:        Mood and Affect: Mood normal.        Behavior: Behavior normal.     Diabetic Foot Exam - Simple   No data filed      Lab Results  Component Value Date   WBC 5.8 02/16/2022   HGB 13.2 02/16/2022   HCT 38.6 02/16/2022   PLT 188 02/16/2022   GLUCOSE 115 (H) 02/16/2022   CHOL 125 02/16/2022   TRIG 164 (H) 02/16/2022   HDL 30 (L) 02/16/2022   LDLCALC 67 02/16/2022   ALT 27 02/16/2022   AST 24 02/16/2022   NA 139 02/16/2022   K 4.5 02/16/2022   CL 105 02/16/2022   CREATININE 1.12 02/16/2022   BUN 27 02/16/2022   CO2 21 02/16/2022   TSH 1.950 10/17/2021   INR 0.96 08/22/2010      Assessment & Plan:   Problem List Items Addressed This Visit       Cardiovascular and Mediastinum   Hypertensive heart disease without heart failure - Primary    Well controlled.  No changes to medicines. Currently taking Amlodipine 10 mg daily, Aspirin 81 mg daily, Carvedilol 6.25 mg twice a day, Lisinopril 40 mg twice a day. Continue to work on eating a healthy diet and exercise.  Labs  drawn today.  Relevant Orders   CBC with Differential/Platelet (Completed)   Comprehensive metabolic panel (Completed)   Lipid panel (Completed)   Coronary artery disease due to lipid rich plaque    The current medical regimen is effective;  continue present plan and medications rosuvastatin 20 mg daily. Stable.        Other   Mixed hyperlipidemia    Well controlled.  No changes to medicines. Rosuvastatin 20 mg daily, CoQ10 100 mg TID, Fenofibrate 160 mg daily.Continue to work on eating a healthy diet and exercise.  Labs drawn today.       Other Visit Diagnoses     Need for hepatitis C screening test       Relevant Orders   HCV Ab w Reflex to Quant PCR (Completed)     .  No orders of the defined types were placed in this encounter.   Orders Placed This Encounter  Procedures   CBC with Differential/Platelet   Comprehensive metabolic panel   Lipid panel   HCV Ab w Reflex to Quant PCR   Interpretation:   Cardiovascular Risk Assessment   Follow-up: Return in about 3 months (around 05/19/2022) for chronic fasting, AWV WITH KIM AFTER 05/26/2021.  An After Visit Summary was printed and given to the patient.  Rochel Brome, MD [facility] [phone]

## 2022-02-16 ENCOUNTER — Encounter: Payer: Self-pay | Admitting: Family Medicine

## 2022-02-16 ENCOUNTER — Ambulatory Visit (INDEPENDENT_AMBULATORY_CARE_PROVIDER_SITE_OTHER): Payer: Medicare Other | Admitting: Family Medicine

## 2022-02-16 ENCOUNTER — Other Ambulatory Visit: Payer: Self-pay | Admitting: Family Medicine

## 2022-02-16 VITALS — BP 120/60 | HR 65 | Temp 98.0°F | Resp 15 | Ht 69.0 in | Wt 192.0 lb

## 2022-02-16 DIAGNOSIS — E782 Mixed hyperlipidemia: Secondary | ICD-10-CM | POA: Diagnosis not present

## 2022-02-16 DIAGNOSIS — I119 Hypertensive heart disease without heart failure: Secondary | ICD-10-CM

## 2022-02-16 DIAGNOSIS — I2583 Coronary atherosclerosis due to lipid rich plaque: Secondary | ICD-10-CM

## 2022-02-16 DIAGNOSIS — Z1159 Encounter for screening for other viral diseases: Secondary | ICD-10-CM

## 2022-02-16 DIAGNOSIS — R7301 Impaired fasting glucose: Secondary | ICD-10-CM | POA: Diagnosis not present

## 2022-02-16 DIAGNOSIS — I2510 Atherosclerotic heart disease of native coronary artery without angina pectoris: Secondary | ICD-10-CM | POA: Diagnosis not present

## 2022-02-16 NOTE — Assessment & Plan Note (Signed)
Well controlled.  No changes to medicines. Rosuvastatin 20 mg daily, CoQ10 100 mg TID, Fenofibrate 160 mg daily.Continue to work on eating a healthy diet and exercise.  Labs drawn today.

## 2022-02-16 NOTE — Assessment & Plan Note (Signed)
Well controlled.  No changes to medicines. Currently taking Amlodipine 10 mg daily, Aspirin 81 mg daily, Carvedilol 6.25 mg twice a day, Lisinopril 40 mg twice a day. Continue to work on eating a healthy diet and exercise.  Labs drawn today.

## 2022-02-16 NOTE — Assessment & Plan Note (Signed)
The current medical regimen is effective;  continue present plan and medications rosuvastatin 20 mg daily. Stable.

## 2022-02-17 LAB — COMPREHENSIVE METABOLIC PANEL
ALT: 27 IU/L (ref 0–44)
AST: 24 IU/L (ref 0–40)
Albumin/Globulin Ratio: 2.2 (ref 1.2–2.2)
Albumin: 4.3 g/dL (ref 3.7–4.7)
Alkaline Phosphatase: 47 IU/L (ref 44–121)
BUN/Creatinine Ratio: 24 (ref 10–24)
BUN: 27 mg/dL (ref 8–27)
Bilirubin Total: 0.3 mg/dL (ref 0.0–1.2)
CO2: 21 mmol/L (ref 20–29)
Calcium: 9.6 mg/dL (ref 8.6–10.2)
Chloride: 105 mmol/L (ref 96–106)
Creatinine, Ser: 1.12 mg/dL (ref 0.76–1.27)
Globulin, Total: 2 g/dL (ref 1.5–4.5)
Glucose: 115 mg/dL — ABNORMAL HIGH (ref 70–99)
Potassium: 4.5 mmol/L (ref 3.5–5.2)
Sodium: 139 mmol/L (ref 134–144)
Total Protein: 6.3 g/dL (ref 6.0–8.5)
eGFR: 68 mL/min/{1.73_m2} (ref >59)

## 2022-02-17 LAB — CBC WITH DIFFERENTIAL/PLATELET
Basophils Absolute: 0.1 10*3/uL (ref 0.0–0.2)
Basos: 1 %
EOS (ABSOLUTE): 0.4 10*3/uL (ref 0.0–0.4)
Eos: 7 %
Hematocrit: 38.6 % (ref 37.5–51.0)
Hemoglobin: 13.2 g/dL (ref 13.0–17.7)
Immature Grans (Abs): 0 10*3/uL (ref 0.0–0.1)
Immature Granulocytes: 0 %
Lymphocytes Absolute: 1.1 10*3/uL (ref 0.7–3.1)
Lymphs: 18 %
MCH: 29.9 pg (ref 26.6–33.0)
MCHC: 34.2 g/dL (ref 31.5–35.7)
MCV: 87 fL (ref 79–97)
Monocytes Absolute: 0.6 10*3/uL (ref 0.1–0.9)
Monocytes: 9 %
Neutrophils Absolute: 3.7 10*3/uL (ref 1.4–7.0)
Neutrophils: 65 %
Platelets: 188 10*3/uL (ref 150–450)
RBC: 4.42 x10E6/uL (ref 4.14–5.80)
RDW: 13 % (ref 11.6–15.4)
WBC: 5.8 10*3/uL (ref 3.4–10.8)

## 2022-02-17 LAB — LIPID PANEL
Chol/HDL Ratio: 4.2 ratio (ref 0.0–5.0)
Cholesterol, Total: 125 mg/dL (ref 100–199)
HDL: 30 mg/dL — ABNORMAL LOW (ref >39)
LDL Chol Calc (NIH): 67 mg/dL (ref 0–99)
Triglycerides: 164 mg/dL — ABNORMAL HIGH (ref 0–149)
VLDL Cholesterol Cal: 28 mg/dL (ref 5–40)

## 2022-02-17 LAB — HCV AB W REFLEX TO QUANT PCR: HCV Ab: NONREACTIVE

## 2022-02-17 LAB — HCV INTERPRETATION

## 2022-02-19 LAB — HGB A1C W/O EAG: Hgb A1c MFr Bld: 5.9 % — ABNORMAL HIGH (ref 4.8–5.6)

## 2022-02-19 LAB — SPECIMEN STATUS REPORT

## 2022-03-06 ENCOUNTER — Telehealth: Payer: Self-pay

## 2022-03-06 NOTE — Progress Notes (Signed)
Chronic Care Management Pharmacy Assistant   Name: Bryan Zimmerman  MRN: [id number] DOB: 19-Jan-1946   Reason for Encounter: General Adherence Call   Recent office visits:  02/16/22 Bryan Brome MD. Seen for routine visit. D/C cyclobenzaprine '5mg'$ .  11/07/21 Zimmerman, Kirsten MD. Seen for neck pain/bilateral shoulder. Increased Amlodipine to '10mg'$  and Decrreased Carvedilol to 6.'25mg'$ .  10/17/21 Zimmerman, Kirsten MD. Seen for shoulder pain. Started on Amlodipine Besylate '5mg'$ , Cyclobenzaprine HCI '5mg'$  and Naproxen '500mg'$ .   Recent consult visits:  None  Hospital visits:  None  Medications: Outpatient Encounter Medications as of 03/06/2022  Medication Sig   amLODipine (NORVASC) 10 MG tablet Take 1 tablet (10 mg total) by mouth daily.   aspirin EC 81 MG tablet Take 81 mg by mouth daily. Swallow whole.   Boswellia-Glucosamine-Vit D (OSTEO BI-FLEX ONE PER DAY PO) Take 1 tablet by mouth daily.   carvedilol (COREG) 6.25 MG tablet Take 1 tablet (6.25 mg total) by mouth 2 (two) times daily with a meal.   cetirizine (ZYRTEC) 10 MG tablet Take 10 mg by mouth daily.   co-enzyme Q-10 30 MG capsule Take 100 mg by mouth 3 (three) times daily.   fenofibrate 160 MG tablet Take 1 tablet (160 mg total) by mouth daily.   lisinopril (ZESTRIL) 40 MG tablet Take 1 tablet (40 mg total) by mouth 2 (two) times daily.   Misc Natural Products (PROSTATE SUPPORT) 300-15 MG TABS Take 1 tablet by mouth daily.   nitroGLYCERIN (NITROSTAT) 0.4 MG SL tablet Place 0.4 mg under the tongue every 5 (five) minutes as needed for chest pain.   rosuvastatin (CRESTOR) 20 MG tablet TAKE 1 TABLET DAILY   No facility-administered encounter medications on file as of 03/06/2022.    Contacted Bryan Zimmerman for [employer] Review Call   Chart Review:  Have there been any documented new, changed, or discontinued medications since last visit? Yes  Has there been any documented recent hospitalizations or ED visits since last visit with Clinical  Pharmacist? No Brief Summary (including medication and/or Diagnosis changes):   Adherence Review:  Does the Clinical Pharmacist Assistant have access to adherence rates? Yes Adherence rates for STAR metric medications (List medication(s)/day supply/ last 2 fill dates).Lisinopril 12/29/21-09/19/21 90ds Rosuvastatin 02/16/22-12/06/21 90ds Adherence rates for medications indicated for disease state being reviewed (List medication(s)/day supply/ last 2 fill dates). Does the patient have >5 day gap between last estimated fill dates for any of the above medications or other medication gaps? Yes Reason for medication gaps.Pt stated she has everything set on auto fill and may have forgotten the Rosuvastatin and had to call it in and that takes longer to receive    Disease State Questions:  Able to connect with Patient? Yes Did patient have any problems with their health recently? No Note problems and Concerns: Have you had any admissions or emergency room visits or worsening of your condition(s) since last visit? No Details of ED visit, hospital visit and/or worsening condition(s): Have you had any visits with new specialists or providers since your last visit? No Explain: Have you had any new health care problem(s) since your last visit? No New problem(s) reported: Have you run out of any of your medications since you last spoke with clinical pharmacist? No What caused you to run out of your medications? Are there any medications you are not taking as prescribed? No What kept you from taking your medications as prescribed? Are you having any issues or side effects with your medications? No  Note of issues or side effects: Do you have any other health concerns or questions you want to discuss with your Clinical Pharmacist before your next visit? No Note additional concerns and questions from Patient. Are there any health concerns that you feel we can do a better job addressing? No Note Patient's  response. Are you having any problems with any of the following since the last visit: (select all that apply)  None  Details: 12. Any falls since last visit? No  Details: 13. Any increased or uncontrolled pain since last visit? No  Details: 14. Next visit Type: telephone       Visit with:Bryan Zimmerman, [place]        Date:04/18/22         Time:3:00pm  15. Additional Details? No    Bryan Zimmerman, [facility] Catering manager  [phone]

## 2022-03-12 ENCOUNTER — Other Ambulatory Visit: Payer: Self-pay | Admitting: Family Medicine

## 2022-03-13 ENCOUNTER — Other Ambulatory Visit: Payer: Self-pay

## 2022-03-13 MED ORDER — NITROGLYCERIN 0.4 MG SL SUBL: 0.4000 mg | SUBLINGUAL_TABLET | SUBLINGUAL | 2 refills | Status: DC | PRN

## 2022-03-13 NOTE — Telephone Encounter (Signed)
-----   Message from Eulis Canner sent at 03/13/2022  1:31 PM EDT ----- Regarding: refill Good afternoon,  Pt wife called and stated his nitroGLYCERIN (NITROSTAT) 0.4 MG SL tablet [8712]  is 76 years old and expired and request a new prescription sent to [facility]. Please advise   Thank you  [name], [place] Pharmacist Assistant  [phone]

## 2022-04-18 ENCOUNTER — Telehealth: Payer: Self-pay

## 2022-04-18 ENCOUNTER — Telehealth: Payer: Medicare Other

## 2022-04-18 NOTE — Chronic Care Management (AMB) (Signed)
    Chronic Care Management Pharmacy Assistant   Name: Bryan Zimmerman  MRN: [id number] DOB: 03/24/1946  Reason for Encounter: Medication Review  Patient will be transferring to [facility] when he is due for medications. 90 day vials w/o safety tops will home delivered starting 05/22/2022. Patient will receive the following medications when due:  Carvedilol 6.25 mg- 1 tablet daily (Patient currently has 12.5 mg on hand, will take 1/2 tablet daily, has #317 on hand) new prescription not needed until 01/31/2023 Norvasc 10 mg- 1 tablet daily (#68 on hand) new prescription due 05/27/2022. Lisinopril 40 mg- 2 tablets a day (#369 on hand) new prescription due 09/20/2022. Rosuvastatin 20 mg- 1 tablet nightly (#109 on hand) new prescription due 07/07/2022. Fenofibrate 160 mg- 1 tablet nightly (#90 on hand) new prescription due 06/18/2022. Nitroglycerin 0.4 mg- place 1 tablet under tongue every 5 mins as needed for chest pains (prescription on hand from 03/14/2022, patient will let us know when he needs)  Onboard form filled out and sent to [facility], will request refills 2 weeks closer to due dates.   Medications: Outpatient Encounter Medications as of 04/18/2022  Medication Sig   amLODipine (NORVASC) 10 MG tablet Take 1 tablet (10 mg total) by mouth daily.   aspirin EC 81 MG tablet Take 81 mg by mouth daily. Swallow whole.   Boswellia-Glucosamine-Vit D (OSTEO BI-FLEX ONE PER DAY PO) Take 1 tablet by mouth daily.   carvedilol (COREG) 6.25 MG tablet Take 1 tablet (6.25 mg total) by mouth 2 (two) times daily with a meal.   cetirizine (ZYRTEC) 10 MG tablet Take 10 mg by mouth daily.   co-enzyme Q-10 30 MG capsule Take 100 mg by mouth 3 (three) times daily.   fenofibrate 160 MG tablet TAKE 1 TABLET DAILY   lisinopril (ZESTRIL) 40 MG tablet TAKE 1 TABLET TWICE A DAY   Misc Natural Products (PROSTATE SUPPORT) 300-15 MG TABS Take 1 tablet by mouth daily.   nitroGLYCERIN (NITROSTAT) 0.4 MG  SL tablet Place 1 tablet (0.4 mg total) under the tongue every 5 (five) minutes as needed for chest pain.   rosuvastatin (CRESTOR) 20 MG tablet TAKE 1 TABLET DAILY   No facility-administered encounter medications on file as of 04/18/2022.   Pattricia Boss, [place] Pharmacist Assistant [phone]

## 2022-04-26 ENCOUNTER — Ambulatory Visit (INDEPENDENT_AMBULATORY_CARE_PROVIDER_SITE_OTHER): Payer: PPO

## 2022-04-26 NOTE — Progress Notes (Signed)
Chronic Care Management Pharmacy Note  04/26/2022 Name:  Bryan Zimmerman MRN:  [id number] DOB:  [birth year]    Summary:  Pleasant 76 year old male presents for f/u CCM visit with his spouse, Bryan Zimmerman, who manages all his meds. He is still very active post heart attack (2010) and is still doing yardwork and chores  Plan Updates:  Onboard to [facility]  Subjective: Bryan Zimmerman is an 76 y.o. year old male who is a primary patient of Cox, Kirsten, MD.  The CCM team was consulted for assistance with disease management and care coordination needs.    Engaged with patient by telephone for follow up visit in response to provider referral for pharmacy case management and/or care coordination services.   Consent to Services:  The patient was given the following information about Chronic Care Management services today, agreed to services, and gave verbal consent: 1. CCM service includes personalized support from designated clinical staff supervised by the primary care provider, including individualized plan of care and coordination with other care providers 2. 24/7 contact phone numbers for assistance for urgent and routine care needs. 3. Service will only be billed when office clinical staff spend 20 minutes or more in a month to coordinate care. 4. Only one practitioner may furnish and bill the service in a calendar month. 5.The patient may stop CCM services at any time (effective at the end of the month) by phone call to the office staff. 6. The patient will be responsible for cost sharing (co-pay) of up to 20% of the service fee (after annual deductible is met). Patient agreed to services and consent obtained.  Patient Care Team: Rochel Brome, MD as PCP - General (Family Medicine) Park Liter, MD as Consulting Physician (Cardiology) [name], [facility] (Pharmacist)  Recent office visits: None since last visit Recent consult visits: None since last visit  Hospital visits: None in  previous 6 months  Objective:  Lab Results  Component Value Date   CREATININE 1.12 02/16/2022   BUN 27 02/16/2022   GFRNONAA 64 06/03/2020   GFRAA 74 06/03/2020   NA 139 02/16/2022   K 4.5 02/16/2022   CALCIUM 9.6 02/16/2022   CO2 21 02/16/2022   GLUCOSE 115 (H) 02/16/2022    Lab Results  Component Value Date/Time   HGBA1C 5.9 (H) 02/16/2022 08:47 AM    Last diabetic Eye exam: No results found for: "HMDIABEYEEXA"  Last diabetic Foot exam: No results found for: "HMDIABFOOTEX"   Lab Results  Component Value Date   CHOL 125 02/16/2022   HDL 30 (L) 02/16/2022   LDLCALC 67 02/16/2022   TRIG 164 (H) 02/16/2022   CHOLHDL 4.2 02/16/2022       Latest Ref Rng & Units 02/16/2022    8:47 AM 01/08/2022    9:06 AM 10/17/2021   12:20 PM  Hepatic Function  Total Protein 6.0 - 8.5 g/dL 6.3  6.9  6.8   Albumin 3.7 - 4.7 g/dL 4.3  4.6  4.6   AST 0 - 40 IU/L 24  31  29    ALT 0 - 44 IU/L 27  29  26    Alk Phosphatase 44 - 121 IU/L 47  49  48   Total Bilirubin 0.0 - 1.2 mg/dL 0.3  0.3  0.3     Lab Results  Component Value Date/Time   TSH 1.950 10/17/2021 12:20 PM   TSH 1.220 06/03/2020 11:07 AM       Latest Ref Rng & Units 02/16/2022  8:47 AM 01/08/2022    9:06 AM 10/17/2021   12:20 PM  CBC  WBC 3.4 - 10.8 x10E3/uL 5.8  6.7  7.6   Hemoglobin 13.0 - 17.7 g/dL 13.2  14.0  14.6   Hematocrit 37.5 - 51.0 % 38.6  42.5  43.8   Platelets 150 - 450 x10E3/uL 188  215  205     No results found for: "VD25OH"  Clinical ASCVD: Yes  The ASCVD Risk score (Arnett DK, et al., 2019) failed to calculate for the following reasons:   The patient has a prior MI or stroke diagnosis       07/12/2021    8:25 AM 05/25/2021    3:42 PM 01/09/2021    1:40 PM  Depression screen PHQ 2/9  Decreased Interest 0 0 0  Down, Depressed, Hopeless 0 0 0  PHQ - 2 Score 0 0 0     Social History   Tobacco Use  Smoking Status Former   Types: Cigarettes  Smokeless Tobacco Never   BP Readings from Last  3 Encounters:  02/16/22 120/60  11/07/21 (!) 150/68  10/17/21 (!) 172/62   Pulse Readings from Last 3 Encounters:  02/16/22 65  11/07/21 72  10/17/21 (!) 58   Wt Readings from Last 3 Encounters:  02/16/22 192 lb (87.1 kg)  11/07/21 189 lb (85.7 kg)  10/17/21 190 lb (86.2 kg)   BMI Readings from Last 3 Encounters:  02/16/22 28.35 kg/m  11/07/21 27.91 kg/m  10/17/21 28.06 kg/m    Assessment/Interventions: Review of patient past medical history, allergies, medications, health status, including review of consultants reports, laboratory and other test data, was performed as part of comprehensive evaluation and provision of chronic care management services.   SDOH:  (Social Determinants of Health) assessments and interventions performed: Yes SDOH Interventions    Flowsheet Row Chronic Care Management from 04/26/2022 in [place] Interventions   Transportation Interventions Intervention Not Indicated  Financial Strain Interventions Intervention Not Indicated      SDOH Screenings   Food Insecurity: No Food Insecurity (09/14/2020)  Housing: Low Risk  (09/14/2020)  Transportation Needs: No Transportation Needs (04/26/2022)  Alcohol Screen: Low Risk  (01/09/2021)  Depression (PHQ2-9): Low Risk  (07/12/2021)  Financial Resource Strain: Low Risk  (04/26/2022)  Tobacco Use: Medium Risk (02/16/2022)    [place]  No Known Allergies  Medications Reviewed Today     Reviewed by [name], [facility] (Pharmacist) on 04/26/22 at [place] List Status: <None>   Medication Order Taking? Sig Documenting Provider Last Dose Status Informant  amLODipine (NORVASC) 10 MG tablet [id number] No Take 1 tablet (10 mg total) by mouth daily. Cox, Kirsten, MD Taking Active   aspirin EC 81 MG tablet [id number] No Take 81 mg by mouth daily. Swallow whole. [provider] Taking Active   Boswellia-Glucosamine-Vit D (OSTEO BI-FLEX ONE PER DAY PO) [id number] No Take 1 tablet by mouth  daily. [provider] Taking Active   carvedilol (COREG) 6.25 MG tablet [id number] No Take 1 tablet (6.25 mg total) by mouth 2 (two) times daily with a meal. Cox, Kirsten, MD Taking Active   cetirizine (ZYRTEC) 10 MG tablet [id number] No Take 10 mg by mouth daily. [provider] Taking Active   co-enzyme Q-10 30 MG capsule [id number] No Take 100 mg by mouth 3 (three) times daily. [provider] Taking Active   fenofibrate 160 MG tablet [id number]  TAKE 1 TABLET DAILY Cox, Kirsten, MD  Active   lisinopril (ZESTRIL) 40 MG tablet [id number]  TAKE 1 TABLET TWICE A DAY Cox, Kirsten, MD  Active   Misc Natural Products (PROSTATE SUPPORT) 300-15 MG TABS [id number] No Take 1 tablet by mouth daily. [provider] Taking Active   nitroGLYCERIN (NITROSTAT) 0.4 MG SL tablet [id number]  Place 1 tablet (0.4 mg total) under the tongue every 5 (five) minutes as needed for chest pain. Cox, Elnita Maxwell, MD  Active   rosuvastatin (CRESTOR) 20 MG tablet [id number] No TAKE 1 TABLET DAILY Cox, Elnita Maxwell, MD Taking Active             Patient Active Problem List   Diagnosis Date Noted   Chronic right shoulder pain 10/17/2021   Cervical pain (neck) 10/17/2021   Coronary artery disease due to lipid rich plaque 07/24/2021   Primary osteoarthritis of knee 07/24/2021   Mixed hyperlipidemia 12/13/2019   Hypertensive heart disease without heart failure 12/13/2019    Immunization History  Administered Date(s) Administered   Fluad Quad(high Dose 65+) 04/21/2020, 05/25/2021   Moderna Covid-19 Vaccine Bivalent Booster 36yr & up 07/12/2021   Moderna SARS-COV2 Booster Vaccination 01/09/2021   Moderna Sars-Covid-2 Vaccination 10/02/2019, 10/30/2019, 07/01/2020   Pneumococcal Conjugate-13 06/25/2014   Pneumococcal Polysaccharide-23 06/09/2013   Tdap 08/22/2010, 04/21/2020   Zoster, Live 08/20/2012    Conditions to be addressed/monitored:  Hypertension and Hyperlipidemia  Care Plan : [place] Updates made by KLane Hacker RSewarensince 04/26/2022 12:00 AM     Problem: htn, hld   Priority: High  Onset Date: 12/21/2020     Long-Range Goal: Disease State Management   Start Date: 12/21/2020  Expected End Date: 12/21/2021  Recent Progress: On track  Priority: High  Note:   Current Barriers:  Suboptimal therapeutic regimen for cholesterol due to leg cramps  Pharmacist Clinical Goal(s):  Patient will maintain control of cholesterol as evidenced by lipid panel  through collaboration with PharmD and provider.   Interventions: 1:1 collaboration with Cox, Kirsten, MD regarding development and update of comprehensive plan of care as evidenced by provider attestation and co-signature Inter-disciplinary care team collaboration (see longitudinal plan of care) Comprehensive medication review performed; medication list updated in electronic medical record  Hypertension (BP goal <130/80) -Controlled -Current treatment: Lisinopril 40 mg twice daily Appropriate, Effective, Safe, Accessible Carvedilol 6.25 mg bid Appropriate, Effective, Safe, Accessible -Medications previously tried: none reported  -Current home readings: 121/57, 138/60, 158/74 (before medicine) -Current dietary habits: tries to limit salt  -Current exercise habits: works around house and outdoors daily -Denies hypotensive/hypertensive symptoms -Educated on BP goals and benefits of medications for prevention of heart attack, stroke and kidney damage; Daily salt intake goal < 2300 mg; Exercise goal of 150 minutes per week; Importance of home blood pressure monitoring; -Counseled to monitor BP at home weekly, document, and provide log at future appointments -Counseled on diet and exercise extensively Recommended to continue current medication  Hyperlipidemia/CAD: (LDL goal < 55) -Not ideally controlled -Current treatment: fenofibrate 160 mg daily Appropriate, Effective, Safe, Accessible Rosuvastatin 236m Appropriate, Effective, Safe, Accessible Aspirin 81 mg daily Appropriate, Effective, Safe, Accessible Nitroglycerin 0.4 mg sl prn chest pain Appropriate, Effective, Safe, Accessible Hasn't ever used per patient -Medications previously tried: atorvastatin (leg pain)  -Current dietary patterns: lower sodium and limiting fried foods -Current exercise habits: very active around house -Educated on Cholesterol goals;  Benefits of statin for ASCVD risk reduction; Importance of limiting foods high in cholesterol; Exercise goal of 150 minutes  per week; -Counseled on diet and exercise extensively Recommended to continue current medication   Patient Goals/Self-Care Activities Patient will:  - take medications as prescribed focus on medication adherence by using pill box check blood pressure weekly, document, and provide at future appointments target a minimum of 150 minutes of moderate intensity exercise weekly engage in dietary modifications by limiting sodium and fried/fatty foods  Follow Up Plan: Telephone follow up appointment with care management team member scheduled for: prn  [name], Pharm.D. - [phone]       Medication Assistance: None required.  Patient affirms current coverage meets needs.  Patient's preferred pharmacy is:  [facility], [address] [place] [address] Phone: [phone] Fax: [phone]  [id number] [address]Phone: [phone] Fax: [phone] [facility] [address] [address]y[place]Phone: [phone]: [phone] Uses pill box? Yes Pt endorses 100% compliance  We discussed: Benefits of medication synchronization, packaging and delivery as well as enhanced pharmacist oversight with [facility]. -Verbal consent obtained for  [facility] enhanced pharmacy services (medication synchronization, adherence packaging, delivery coordination). A medication sync plan was created to allow patient to get all medications delivered once every 30 to 90 days per patient preference. Patient understands they have freedom to choose pharmacy and clinical pharmacist will coordinate care between all prescribers and [facility].   Care Plan and Follow Up Patient Decision:  Patient agrees to Care Plan and Follow-up.  Plan: Telephone follow up appointment with care management team member scheduled for:  prn  NArizona Constable Pharm.D. -- [phone]

## 2022-04-26 NOTE — Patient Instructions (Signed)
Visit Information   Goals Addressed   None    Patient Care Plan: CCM Pharmacy Care Plan     Problem Identified: htn, hld   Priority: High  Onset Date: 12/21/2020     Long-Range Goal: Disease State Management   Start Date: 12/21/2020  Expected End Date: 12/21/2021  Recent Progress: On track  Priority: High  Note:   Current Barriers:  Suboptimal therapeutic regimen for cholesterol due to leg cramps  Pharmacist Clinical Goal(s):  Patient will maintain control of cholesterol as evidenced by lipid panel  through collaboration with PharmD and provider.   Interventions: 1:1 collaboration with Zimmerman, Kirsten, MD regarding development and update of comprehensive plan of care as evidenced by provider attestation and co-signature Inter-disciplinary care team collaboration (see longitudinal plan of care) Comprehensive medication review performed; medication list updated in electronic medical record  Hypertension (BP goal <130/80) -Controlled -Current treatment: Lisinopril 40 mg twice daily Appropriate, Effective, Safe, Accessible Carvedilol 6.25 mg bid Appropriate, Effective, Safe, Accessible -Medications previously tried: none reported  -Current home readings: 121/57, 138/60, 158/74 (before medicine) -Current dietary habits: tries to limit salt  -Current exercise habits: works around house and outdoors daily -Denies hypotensive/hypertensive symptoms -Educated on BP goals and benefits of medications for prevention of heart attack, stroke and kidney damage; Daily salt intake goal < 2300 mg; Exercise goal of 150 minutes per week; Importance of home blood pressure monitoring; -Counseled to monitor BP at home weekly, document, and provide log at future appointments -Counseled on diet and exercise extensively Recommended to continue current medication  Hyperlipidemia/CAD: (LDL goal < 55) -Not ideally controlled -Current treatment: fenofibrate 160 mg daily Appropriate, Effective, Safe,  Accessible Rosuvastatin '20mg'$  Appropriate, Effective, Safe, Accessible Aspirin 81 mg daily Appropriate, Effective, Safe, Accessible Nitroglycerin 0.4 mg sl prn chest pain Appropriate, Effective, Safe, Accessible Hasn't ever used per patient -Medications previously tried: atorvastatin (leg pain)  -Current dietary patterns: lower sodium and limiting fried foods -Current exercise habits: very active around house -Educated on Cholesterol goals;  Benefits of statin for ASCVD risk reduction; Importance of limiting foods high in cholesterol; Exercise goal of 150 minutes per week; -Counseled on diet and exercise extensively Recommended to continue current medication   Patient Goals/Self-Care Activities Patient will:  - take medications as prescribed focus on medication adherence by using pill box check blood pressure weekly, document, and provide at future appointments target a minimum of 150 minutes of moderate intensity exercise weekly engage in dietary modifications by limiting sodium and fried/fatty foods  Follow Up Plan: Telephone follow up appointment with care management team member scheduled for: prn  Bryan Zimmerman, Pharm.D. - [phone]      Bryan Zimmerman was given information about Chronic Care Management services today including:  CCM service includes personalized support from designated clinical staff supervised by his physician, including individualized plan of care and coordination with other care providers 24/7 contact phone numbers for assistance for urgent and routine care needs. Standard insurance, coinsurance, copays and deductibles apply for chronic care management only during months in which we provide at least 20 minutes of these services. Most insurances cover these services at 100%, however patients may be responsible for any copay, coinsurance and/or deductible if applicable. This service may help you avoid the need for more expensive face-to-face services. Only  one practitioner may furnish and bill the service in a calendar month. The patient may stop CCM services at any time (effective at the end of the month) by phone call to the office staff.  Patient agreed to services and verbal consent obtained.   The patient verbalized understanding of instructions, educational materials, and care plan provided today and DECLINED offer to receive copy of patient instructions, educational materials, and care plan.  The pharmacy team will reach out to the patient again over the next 60 days.   Bryan Zimmerman, [place]

## 2022-04-30 ENCOUNTER — Other Ambulatory Visit: Payer: Self-pay | Admitting: Family Medicine

## 2022-05-01 ENCOUNTER — Telehealth: Payer: Self-pay

## 2022-05-01 ENCOUNTER — Other Ambulatory Visit: Payer: Self-pay

## 2022-05-01 MED ORDER — AMLODIPINE BESYLATE 10 MG PO TABS: 10.0000 mg | ORAL_TABLET | Freq: Every day | ORAL | 1 refills | Status: DC

## 2022-05-01 NOTE — Chronic Care Management (AMB) (Signed)
Wife called stating she received a message from [facility] that patient's A[facility]ipine came to them but they are no longer with [facility] and have been transferred to [facility]. Request was resent to [facility] for Amlodipine today. Will ensure that the remainder of patient's medications go to [facility], Wife aware.  Pattricia Boss, [place] Pharmacist Assistant [phone]'

## 2022-05-14 ENCOUNTER — Other Ambulatory Visit: Payer: Self-pay

## 2022-05-14 MED ORDER — FENOFIBRATE 160 MG PO TABS: 160.0000 mg | ORAL_TABLET | Freq: Every day | ORAL | 1 refills | Status: DC

## 2022-05-14 MED ORDER — ROSUVASTATIN CALCIUM 20 MG PO TABS: 20.0000 mg | ORAL_TABLET | Freq: Every day | ORAL | 1 refills | Status: DC

## 2022-05-18 ENCOUNTER — Other Ambulatory Visit: Payer: PPO

## 2022-05-18 DIAGNOSIS — M810 Age-related osteoporosis without current pathological fracture: Secondary | ICD-10-CM | POA: Diagnosis not present

## 2022-05-18 DIAGNOSIS — I119 Hypertensive heart disease without heart failure: Secondary | ICD-10-CM

## 2022-05-18 DIAGNOSIS — E782 Mixed hyperlipidemia: Secondary | ICD-10-CM

## 2022-05-18 DIAGNOSIS — R7303 Prediabetes: Secondary | ICD-10-CM | POA: Diagnosis not present

## 2022-05-19 DIAGNOSIS — I119 Hypertensive heart disease without heart failure: Secondary | ICD-10-CM

## 2022-05-19 DIAGNOSIS — I2510 Atherosclerotic heart disease of native coronary artery without angina pectoris: Secondary | ICD-10-CM | POA: Diagnosis not present

## 2022-05-19 DIAGNOSIS — E785 Hyperlipidemia, unspecified: Secondary | ICD-10-CM | POA: Diagnosis not present

## 2022-05-19 LAB — LIPID PANEL
Chol/HDL Ratio: 3.4 ratio (ref 0.0–5.0)
Cholesterol, Total: 128 mg/dL (ref 100–199)
HDL: 38 mg/dL — ABNORMAL LOW (ref >39)
LDL Chol Calc (NIH): 70 mg/dL (ref 0–99)
Triglycerides: 109 mg/dL (ref 0–149)
VLDL Cholesterol Cal: 20 mg/dL (ref 5–40)

## 2022-05-19 LAB — COMPREHENSIVE METABOLIC PANEL
ALT: 32 IU/L (ref 0–44)
AST: 42 IU/L — ABNORMAL HIGH (ref 0–40)
Albumin/Globulin Ratio: 1.9 (ref 1.2–2.2)
Albumin: 4.4 g/dL (ref 3.8–4.8)
Alkaline Phosphatase: 43 IU/L — ABNORMAL LOW (ref 44–121)
BUN/Creatinine Ratio: 18 (ref 10–24)
BUN: 19 mg/dL (ref 8–27)
Bilirubin Total: 0.4 mg/dL (ref 0.0–1.2)
CO2: 21 mmol/L (ref 20–29)
Calcium: 9.6 mg/dL (ref 8.6–10.2)
Chloride: 105 mmol/L (ref 96–106)
Creatinine, Ser: 1.06 mg/dL (ref 0.76–1.27)
Globulin, Total: 2.3 g/dL (ref 1.5–4.5)
Glucose: 95 mg/dL (ref 70–99)
Potassium: 4.6 mmol/L (ref 3.5–5.2)
Sodium: 139 mmol/L (ref 134–144)
Total Protein: 6.7 g/dL (ref 6.0–8.5)
eGFR: 72 mL/min/{1.73_m2} (ref >59)

## 2022-05-19 LAB — CBC WITH DIFFERENTIAL/PLATELET
Basophils Absolute: 0 10*3/uL (ref 0.0–0.2)
Basos: 1 %
EOS (ABSOLUTE): 0.4 10*3/uL (ref 0.0–0.4)
Eos: 7 %
Hematocrit: 39.5 % (ref 37.5–51.0)
Hemoglobin: 13.4 g/dL (ref 13.0–17.7)
Immature Grans (Abs): 0 10*3/uL (ref 0.0–0.1)
Immature Granulocytes: 0 %
Lymphocytes Absolute: 1.1 10*3/uL (ref 0.7–3.1)
Lymphs: 21 %
MCH: 29.6 pg (ref 26.6–33.0)
MCHC: 33.9 g/dL (ref 31.5–35.7)
MCV: 87 fL (ref 79–97)
Monocytes Absolute: 0.6 10*3/uL (ref 0.1–0.9)
Monocytes: 11 %
Neutrophils Absolute: 3.4 10*3/uL (ref 1.4–7.0)
Neutrophils: 60 %
Platelets: 209 10*3/uL (ref 150–450)
RBC: 4.52 x10E6/uL (ref 4.14–5.80)
RDW: 12.9 % (ref 11.6–15.4)
WBC: 5.5 10*3/uL (ref 3.4–10.8)

## 2022-05-19 LAB — VITAMIN D 25 HYDROXY (VIT D DEFICIENCY, FRACTURES): Vit D, 25-Hydroxy: 28.4 ng/mL — ABNORMAL LOW (ref 30.0–100.0)

## 2022-05-19 LAB — HEMOGLOBIN A1C
Est. average glucose Bld gHb Est-mCnc: 123 mg/dL
Hgb A1c MFr Bld: 5.9 % — ABNORMAL HIGH (ref 4.8–5.6)

## 2022-05-19 LAB — CARDIOVASCULAR RISK ASSESSMENT

## 2022-05-20 ENCOUNTER — Encounter: Payer: Self-pay | Admitting: Family Medicine

## 2022-05-22 ENCOUNTER — Ambulatory Visit (INDEPENDENT_AMBULATORY_CARE_PROVIDER_SITE_OTHER): Payer: PPO | Admitting: Family Medicine

## 2022-05-22 ENCOUNTER — Encounter: Payer: Self-pay | Admitting: Family Medicine

## 2022-05-22 VITALS — BP 130/60 | HR 67 | Temp 97.1°F | Ht 69.0 in | Wt 189.0 lb

## 2022-05-22 DIAGNOSIS — I2583 Coronary atherosclerosis due to lipid rich plaque: Secondary | ICD-10-CM | POA: Diagnosis not present

## 2022-05-22 DIAGNOSIS — E782 Mixed hyperlipidemia: Secondary | ICD-10-CM | POA: Diagnosis not present

## 2022-05-22 DIAGNOSIS — I2510 Atherosclerotic heart disease of native coronary artery without angina pectoris: Secondary | ICD-10-CM

## 2022-05-22 DIAGNOSIS — Z23 Encounter for immunization: Secondary | ICD-10-CM | POA: Diagnosis not present

## 2022-05-22 DIAGNOSIS — I119 Hypertensive heart disease without heart failure: Secondary | ICD-10-CM

## 2022-05-22 NOTE — Progress Notes (Signed)
Subjective:  Patient ID: Bryan Zimmerman, male    DOB: [birth year]  Age: 76 y.o. MRN: [id number]  Chief Complaint  Patient presents with   Hyperlipidemia   Hypertension   Coronary Artery Disease    HPI Hypertensive heart disease: currently taking Amlodipine 10 mg daily, Aspirin 81 mg daily, Carvedilol 6.25 mg twice a day, Lisinopril 40 mg twice a day.   Hyperlipidemia: taking Rosuvastatin 20 mg daily, CoQ10 100 mg TID and Fenofibrate 160 mg daily.   Low fat diet and active in yard work.    Current Outpatient Medications on File Prior to Visit  Medication Sig Dispense Refill   amLODipine (NORVASC) 10 MG tablet Take 1 tablet (10 mg total) by mouth daily. 90 tablet 1   aspirin EC 81 MG tablet Take 81 mg by mouth daily. Swallow whole.     Boswellia-Glucosamine-Vit D (OSTEO BI-FLEX ONE PER DAY PO) Take 1 tablet by mouth daily.     carvedilol (COREG) 6.25 MG tablet Take 1 tablet (6.25 mg total) by mouth 2 (two) times daily with a meal. 180 tablet 1   cetirizine (ZYRTEC) 10 MG tablet Take 10 mg by mouth daily.     co-enzyme Q-10 30 MG capsule Take 100 mg by mouth 3 (three) times daily.     fenofibrate 160 MG tablet Take 1 tablet (160 mg total) by mouth daily. 90 tablet 1   lisinopril (ZESTRIL) 40 MG tablet TAKE 1 TABLET TWICE A DAY 180 tablet 3   Misc Natural Products (PROSTATE SUPPORT) 300-15 MG TABS Take 1 tablet by mouth daily.     nitroGLYCERIN (NITROSTAT) 0.4 MG SL tablet Place 1 tablet (0.4 mg total) under the tongue every 5 (five) minutes as needed for chest pain. 25 tablet 2   rosuvastatin (CRESTOR) 20 MG tablet Take 1 tablet (20 mg total) by mouth daily. 90 tablet 1   No current facility-administered medications on file prior to visit.   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      Family History  Problem Relation Age of Onset   Dementia Mother    Cancer Father        pancreatic   Hypertension Other    Hyperlipidemia Other    Social History   Socioeconomic History   Marital status: Married    Spouse name: [name]   Number of children: Not on file   Years of education: Not on file   Highest education level: Not on file  Occupational History   Not on file  Tobacco Use   Smoking status: Former    Types: Cigarettes   Smokeless tobacco: Never  Vaping Use   Vaping Use: Never used  Substance and Sexual Activity   Alcohol use: Never   Drug use: Never   Sexual activity: Not on file  Other Topics Concern   Not on file  Social History Narrative   Not on file   Social Determinants of Health   Financial Resource Strain: Low Risk  (04/26/2022)   Overall Financial Resource Strain (CARDIA)  Difficulty of Paying Living Expenses: Not hard at all  Food Insecurity: No Food Insecurity (09/14/2020)   Hunger Vital Sign    Worried About Running Out of Food in the Last Year: Never true    Ran Out of Food in the Last Year: Never true  Transportation Needs: No Transportation Needs (04/26/2022)   PRAPARE - Hydrologist (Medical): No    Lack of Transportation (Non-Medical): No  Physical Activity: Inactive (05/22/2022)   Exercise Vital Sign    Days of Exercise per Week: 0 days    Minutes of Exercise per Session: 0 min  Stress: No Stress Concern Present (05/22/2022)   [place]    Feeling of Stress : Not at all  Social Connections: Moderately Integrated (05/22/2022)   Social Connection and Isolation Panel [NHANES]    Frequency of Communication with Friends and Family: More than three times a week    Frequency of Social  Gatherings with Friends and Family: More than three times a week    Attends Religious Services: More than 4 times per year    Active Member of [employer] or Organizations: No    Attends Archivist Meetings: Never    Marital Status: Married    Review of Systems  Constitutional:  Negative for chills, diaphoresis, fatigue and fever.  HENT:  Negative for congestion, ear pain and sore throat.   Respiratory:  Negative for cough and shortness of breath.   Cardiovascular:  Negative for chest pain and leg swelling.  Gastrointestinal:  Negative for abdominal pain, constipation, diarrhea, nausea and vomiting.  Genitourinary:  Negative for dysuria and urgency.  Musculoskeletal:  Negative for arthralgias and myalgias.  Neurological:  Negative for dizziness and headaches.  Psychiatric/Behavioral:  Negative for dysphoric mood.      Objective:  BP 130/60   Pulse 67   Temp (!) 97.1 F (36.2 C)   Ht '5\' 9"'$  (1.753 m)   Wt 189 lb (85.7 kg)   SpO2 96%   BMI 27.91 kg/m      05/22/2022    2:10 PM 02/16/2022    8:04 AM 11/07/2021   10:09 AM  BP/Weight  Systolic BP 245 809 983  Diastolic BP 60 60 68  Wt. (Lbs) 189 192 189  BMI 27.91 kg/m2 28.35 kg/m2 27.91 kg/m2    Physical Exam Vitals reviewed.  Constitutional:      Appearance: Normal appearance. He is obese.  Neck:     Vascular: No carotid bruit.  Cardiovascular:     Rate and Rhythm: Normal rate and regular rhythm.     Pulses: Normal pulses.     Heart sounds: Normal heart sounds.  Pulmonary:     Effort: Pulmonary effort is normal.     Breath sounds: Normal breath sounds. No wheezing, rhonchi or rales.  Abdominal:     General: Bowel sounds are normal.     Palpations: Abdomen is soft.     Tenderness: There is no abdominal tenderness.  Neurological:     Mental Status: He is alert.  Psychiatric:        Mood and Affect: Mood normal.        Behavior: Behavior normal.     Diabetic Foot Exam - Simple   No data filed       Lab Results  Component Value Date   WBC 5.5 05/18/2022   HGB 13.4 05/18/2022   HCT 39.5 05/18/2022   PLT 209  05/18/2022   GLUCOSE 95 05/18/2022   CHOL 128 05/18/2022   TRIG 109 05/18/2022   HDL 38 (L) 05/18/2022   LDLCALC 70 05/18/2022   ALT 32 05/18/2022   AST 42 (H) 05/18/2022   NA 139 05/18/2022   K 4.6 05/18/2022   CL 105 05/18/2022   CREATININE 1.06 05/18/2022   BUN 19 05/18/2022   CO2 21 05/18/2022   TSH 1.950 10/17/2021   INR 0.96 08/22/2010   HGBA1C 5.9 (H) 05/18/2022      Assessment & Plan:   Problem List Items Addressed This Visit       Cardiovascular and Mediastinum   Hypertensive heart disease without heart failure - Primary    Well controlled.  No changes to medicines.  Continue to work on eating a healthy diet and exercise.  Labs reviewed      Coronary artery disease due to lipid rich plaque    The current medical regimen is effective;  continue present plan and medications.         Other   Mixed hyperlipidemia    Well controlled.  No changes to medicines.  Continue to work on eating a healthy diet and exercise.  Labs reviewed,      Other Visit Diagnoses     Need for immunization against influenza       Relevant Orders   Flu Vaccine QUAD High Dose(Fluad) (Completed)     .  No orders of the defined types were placed in this encounter.   Orders Placed This Encounter  Procedures   Flu Vaccine QUAD High Dose(Fluad)     Follow-up: Return in about 6 months (around 11/21/2022) for chronic fasting.  An After Visit Summary was printed and given to the patient.  Rochel Brome, MD [facility] [phone]

## 2022-05-26 NOTE — Assessment & Plan Note (Signed)
The current medical regimen is effective;  continue present plan and medications.  

## 2022-05-26 NOTE — Assessment & Plan Note (Signed)
Well controlled.  °No changes to medicines.  °Continue to work on eating a healthy diet and exercise.  °Labs reviewed.  °

## 2022-05-26 NOTE — Assessment & Plan Note (Signed)
Well controlled.  No changes to medicines.  Continue to work on eating a healthy diet and exercise.  Labs reviewed,

## 2022-06-11 ENCOUNTER — Other Ambulatory Visit: Payer: Self-pay

## 2022-06-11 MED ORDER — FENOFIBRATE 160 MG PO TABS: 160.0000 mg | ORAL_TABLET | Freq: Every day | ORAL | 1 refills | Status: DC

## 2022-06-21 ENCOUNTER — Ambulatory Visit (INDEPENDENT_AMBULATORY_CARE_PROVIDER_SITE_OTHER): Payer: PPO

## 2022-06-21 VITALS — BP 124/68 | HR 68 | Ht 69.0 in | Wt 184.0 lb

## 2022-06-21 DIAGNOSIS — Z0000 Encounter for general adult medical examination without abnormal findings: Secondary | ICD-10-CM

## 2022-06-22 NOTE — Patient Instructions (Signed)
Mr. Bryan Zimmerman , Thank you for taking time to come for your Medicare Wellness Visit. I appreciate your ongoing commitment to your health goals. Please review the following plan we discussed and let me know if I can assist you in the future.   Screening recommendations/referrals: Colonoscopy: up-to-date Recommended yearly ophthalmology/optometry visit for glaucoma screening and checkup Recommended yearly dental visit for hygiene and checkup  Vaccinations: Influenza vaccine: up-to-date  Pneumococcal vaccine: Complete Tdap vaccine: due 04/2030 Shingles vaccine: Due - can get at the pharmacy    Advanced directives: Please bring a copy for your medical record  Conditions/risks identified: monitor unintentional weight loss   Preventive Care 65 Years and Older, Male Preventive care refers to lifestyle choices and visits with your health care provider that can promote health and wellness. What does preventive care include? A yearly physical exam. This is also called an annual well check. Dental exams once or twice a year. Routine eye exams. Ask your health care provider how often you should have your eyes checked. Personal lifestyle choices, including: Daily care of your teeth and gums. Regular physical activity. Eating a healthy diet. Avoiding tobacco and drug use. Limiting alcohol use. Practicing safe sex. Taking low doses of aspirin every day. Taking vitamin and mineral supplements as recommended by your health care provider. What happens during an annual well check? The services and screenings done by your health care provider during your annual well check will depend on your age, overall health, lifestyle risk factors, and family history of disease. Counseling  Your health care provider may ask you questions about your: Alcohol use. Tobacco use. Drug use. Emotional well-being. Home and relationship well-being. Sexual activity. Eating habits. History of falls. Memory and  ability to understand (cognition). Work and work Statistician. Screening  You may have the following tests or measurements: Height, weight, and BMI. Blood pressure. Lipid and cholesterol levels. These may be checked every 5 years, or more frequently if you are over 48 years old. Skin check. Lung cancer screening. You may have this screening every year starting at age 60 if you have a 30-pack-year history of smoking and currently smoke or have quit within the past 15 years. Fecal occult blood test (FOBT) of the stool. You may have this test every year starting at age 51. Flexible sigmoidoscopy or colonoscopy. You may have a sigmoidoscopy every 5 years or a colonoscopy every 10 years starting at age 80. Prostate cancer screening. Recommendations will vary depending on your family history and other risks. Hepatitis C blood test. Hepatitis B blood test. Sexually transmitted disease (STD) testing. Diabetes screening. This is done by checking your blood sugar (glucose) after you have not eaten for a while (fasting). You may have this done every 1-3 years. Abdominal aortic aneurysm (AAA) screening. You may need this if you are a current or former smoker. Osteoporosis. You may be screened starting at age 47 if you are at high risk. Talk with your health care provider about your test results, treatment options, and if necessary, the need for more tests. Vaccines  Your health care provider may recommend certain vaccines, such as: Influenza vaccine. This is recommended every year. Tetanus, diphtheria, and acellular pertussis (Tdap, Td) vaccine. You may need a Td booster every 10 years. Zoster vaccine. You may need this after age 53. Pneumococcal 13-valent conjugate (PCV13) vaccine. One dose is recommended after age 36. Pneumococcal polysaccharide (PPSV23) vaccine. One dose is recommended after age 66. Talk to your health care provider about which screenings  and vaccines you need and how often you need  them. This information is not intended to replace advice given to you by your health care provider. Make sure you discuss any questions you have with your health care provider. Document Released: 09/02/2015 Document Revised: 04/25/2016 Document Reviewed: 06/07/2015 Elsevier Interactive Patient Education  2017 [employer] Prevention in the Home Falls can cause injuries. They can happen to people of all ages. There are many things you can do to make your home safe and to help prevent falls. What can I do on the outside of my home? Regularly fix the edges of walkways and driveways and fix any cracks. Remove anything that might make you trip as you walk through a door, such as a raised step or threshold. Trim any bushes or trees on the path to your home. Use bright outdoor lighting. Clear any walking paths of anything that might make someone trip, such as rocks or tools. Regularly check to see if handrails are loose or broken. Make sure that both sides of any steps have handrails. Any raised decks and porches should have guardrails on the edges. Have any leaves, snow, or ice cleared regularly. Use sand or salt on walking paths during winter. Clean up any spills in your garage right away. This includes oil or grease spills. What can I do in the bathroom? Use night lights. Install grab bars by the toilet and in the tub and shower. Do not use towel bars as grab bars. Use non-skid mats or decals in the tub or shower. If you need to sit down in the shower, use a plastic, non-slip stool. Keep the floor dry. Clean up any water that spills on the floor as soon as it happens. Remove soap buildup in the tub or shower regularly. Attach bath mats securely with double-sided non-slip rug tape. Do not have throw rugs and other things on the floor that can make you trip. What can I do in the bedroom? Use night lights. Make sure that you have a light by your bed that is easy to reach. Do not use  any sheets or blankets that are too big for your bed. They should not hang down onto the floor. Have a firm chair that has side arms. You can use this for support while you get dressed. Do not have throw rugs and other things on the floor that can make you trip. What can I do in the kitchen? Clean up any spills right away. Avoid walking on wet floors. Keep items that you use a lot in easy-to-reach places. If you need to reach something above you, use a strong step stool that has a grab bar. Keep electrical cords out of the way. Do not use floor polish or wax that makes floors slippery. If you must use wax, use non-skid floor wax. Do not have throw rugs and other things on the floor that can make you trip. What can I do with my stairs? Do not leave any items on the stairs. Make sure that there are handrails on both sides of the stairs and use them. Fix handrails that are broken or loose. Make sure that handrails are as long as the stairways. Check any carpeting to make sure that it is firmly attached to the stairs. Fix any carpet that is loose or worn. Avoid having throw rugs at the top or bottom of the stairs. If you do have throw rugs, attach them to the floor with carpet tape.  Make sure that you have a light switch at the top of the stairs and the bottom of the stairs. If you do not have them, ask someone to add them for you. What else can I do to help prevent falls? Wear shoes that: Do not have high heels. Have rubber bottoms. Are comfortable and fit you well. Are closed at the toe. Do not wear sandals. If you use a stepladder: Make sure that it is fully opened. Do not climb a closed stepladder. Make sure that both sides of the stepladder are locked into place. Ask someone to hold it for you, if possible. Clearly mark and make sure that you can see: Any grab bars or handrails. First and last steps. Where the edge of each step is. Use tools that help you move around (mobility aids)  if they are needed. These include: Canes. Walkers. Scooters. Crutches. Turn on the lights when you go into a dark area. Replace any light bulbs as soon as they burn out. Set up your furniture so you have a clear path. Avoid moving your furniture around. If any of your floors are uneven, fix them. If there are any pets around you, be aware of where they are. Review your medicines with your doctor. Some medicines can make you feel dizzy. This can increase your chance of falling. Ask your doctor what other things that you can do to help prevent falls. This information is not intended to replace advice given to you by your health care provider. Make sure you discuss any questions you have with your health care provider. Document Released: 06/02/2009 Document Revised: 01/12/2016 Document Reviewed: 09/10/2014 Elsevier Interactive Patient Education  2017 [employer].

## 2022-06-22 NOTE — Progress Notes (Signed)
Subjective:   Bryan Zimmerman is a 76 y.o. male who presents for Medicare Annual/Subsequent preventive examination.  This wellness visit is conducted by a nurse.  The patient's medications were reviewed and reconciled since the patient's last visit.  History details were provided by the patient.  The history appears to be reliable.    Medical History: Patient history and Family history was reviewed  Medications, Allergies, and preventative health maintenance was reviewed and updated.  Cardiac Risk Factors include: advanced age (>79mn, >>64women);diabetes mellitus;male gender     Objective:    Today's Vitals   06/22/22 0923  BP: 124/68  Pulse: 68  Weight: 184 lb (83.5 kg)  Height: '5\' 9"'$  (1.753 m)   Body mass index is 27.17 kg/m.     05/25/2021    3:41 PM 12/02/2019   11:34 AM  Advanced Directives  Does Patient Have a Medical Advance Directive? Yes Yes  Type of AParamedicof [place] will [place] will  Does patient want to make changes to medical advance directive? No - Patient declined   Copy of HHenriettain Chart? No - copy requested     Current Medications (verified) Outpatient Encounter Medications as of 06/21/2022  Medication Sig   amLODipine (NORVASC) 10 MG tablet Take 1 tablet (10 mg total) by mouth daily.   aspirin EC 81 MG tablet Take 81 mg by mouth daily. Swallow whole.   Boswellia-Glucosamine-Vit D (OSTEO BI-FLEX ONE PER DAY PO) Take 1 tablet by mouth daily.   carvedilol (COREG) 6.25 MG tablet Take 1 tablet (6.25 mg total) by mouth 2 (two) times daily with a meal.   cetirizine (ZYRTEC) 10 MG tablet Take 10 mg by mouth daily.   co-enzyme Q-10 30 MG capsule Take 100 mg by mouth 3 (three) times daily.   fenofibrate 160 MG tablet Take 1 tablet (160 mg total) by mouth daily.   lisinopril (ZESTRIL) 40 MG tablet TAKE 1 TABLET TWICE A DAY   Misc Natural Products (PROSTATE SUPPORT) 300-15 MG  TABS Take 1 tablet by mouth daily.   nitroGLYCERIN (NITROSTAT) 0.4 MG SL tablet Place 1 tablet (0.4 mg total) under the tongue every 5 (five) minutes as needed for chest pain.   rosuvastatin (CRESTOR) 20 MG tablet Take 1 tablet (20 mg total) by mouth daily.   No facility-administered encounter medications on file as of 06/21/2022.    Allergies (verified) Patient has no known allergies.   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     Family History  Problem Relation Age of Onset   Dementia Mother    Cancer Father        pancreatic   Hypertension Other    Hyperlipidemia Other    Social History   Socioeconomic History   Marital status: Married    Spouse name: NIzora Gala  Number of children: Not on file   Years of education: Not on file   Highest education level: Not on file  Occupational History   Not on file  Tobacco Use   Smoking status: Former    Types: Cigarettes   Smokeless tobacco: Never  Vaping Use   Vaping Use: Never used  Substance and Sexual Activity   Alcohol use: Never   Drug use: Never   Sexual activity: Not on file  Other Topics Concern   Not on file  Social History Narrative   Not on file   Social Determinants of Health   Financial Resource Strain: Low Risk  (04/26/2022)   Overall Financial Resource Strain (CARDIA)    Difficulty of Paying Living Expenses: Not hard at all  Food Insecurity: No Food Insecurity (09/14/2020)   Hunger Vital Sign    Worried About Running Out of Food in the Last Year: Never true    Ran Out of Food in the Last Year: Never true  Transportation Needs: No Transportation Needs  (04/26/2022)   PRAPARE - Hydrologist (Medical): No    Lack of Transportation (Non-Medical): No  Physical Activity: Inactive (05/22/2022)   Exercise Vital Sign    Days of Exercise per Week: 0 days    Minutes of Exercise per Session: 0 min  Stress: No Stress Concern Present (05/22/2022)   [place]    Feeling of Stress : Not at all  Social Connections: Moderately Integrated (05/22/2022)   Social Connection and Isolation Panel [NHANES]    Frequency of Communication with Friends and Family: More than three times a week    Frequency of Social Gatherings with Friends and Family: More than three times a week    Attends Religious Services: More than 4 times per year    Active Member of [employer] or Organizations: No    Attends Music therapist: Never    Marital Status: Married    Tobacco Counseling Counseling given: No tobacco products used   Clinical Intake:  Pre-visit preparation completed: Yes Pain : No/denies pain   BMI - recorded: 27.17 Nutritional Status: BMI 25 -29 Overweight Nutritional Risks: Unintentional weight loss Diabetes: Yes (Last A1C 5.9) How often do you need to have someone help you when you read instructions, pamphlets, or other written materials from your doctor or pharmacy?: 1 - Never Interpreter Needed?: No    Activities of Daily Living    06/22/2022    9:36 AM 05/22/2022    2:11 PM  In your present state of health, do you have any difficulty performing the following activities:  Hearing? 0 0  Vision? 0 0  Difficulty concentrating or making decisions? 0 0  Walking or climbing stairs? 0 0  Dressing or bathing? 0 0  Doing errands, shopping? 0 0  Preparing Food and eating ? N   Using the Toilet? N   In the past six months, have you accidently leaked urine? N   Do you have problems with loss of bowel control? N   Managing your Medications? N   Managing  your Finances? N   Housekeeping or managing your Housekeeping? N     Patient Care Team: Rochel Brome, MD as PCP - General (Family Medicine) Park Liter, MD as Consulting Physician (Cardiology) [name], [facility] (Pharmacist)     Assessment:   This is a routine wellness examination for Maxemiliano.  Hearing/Vision screen No results found.  Dietary issues and exercise activities discussed: Current Exercise Habits: The patient has a physically strenuous job, but has no regular exercise apart from work. (cutting wood around the home, working  outside), Exercise limited by: None identified   Depression Screen    06/22/2022    9:36 AM 05/22/2022    2:11 PM 07/12/2021    8:25 AM 05/25/2021    3:42 PM 01/09/2021    1:40 PM 09/14/2020    8:51 AM 03/21/2020    2:51 PM  PHQ 2/9 Scores  PHQ - 2 Score 0 0 0 0 0 0 0  PHQ- 9 Score 0 0         Fall Risk    06/22/2022    9:35 AM 05/22/2022    2:11 PM 07/12/2021    8:25 AM 05/25/2021    3:41 PM 04/13/2021    1:44 PM  [place] in the past year? 0 0 1 0 0  Number falls in past yr: 0 0 0 0 0  Injury with Fall? 0 0 0 0 0  Risk for fall due to : No Fall Risks No Fall Risks  No Fall Risks   Follow up Falls evaluation completed;Education provided Falls evaluation completed Falls evaluation completed Falls evaluation completed;Education provided Falls evaluation completed    FALL RISK PREVENTION PERTAINING TO THE HOME:  Any stairs in or around the home? Yes  If so, are there any without handrails? No  Home free of loose throw rugs in walkways, pet beds, electrical cords, etc? Yes  Adequate lighting in your home to reduce risk of falls? Yes   ASSISTIVE DEVICES UTILIZED TO PREVENT FALLS:  Life alert? No  Use of a cane, walker or w/c? No  Gait steady and fast without use of assistive device  Cognitive Function:        06/22/2022    9:37 AM 05/26/2021    8:20 AM  6CIT Screen  What Year? 0 points 0 points  What month? 0  points 0 points  What time? 0 points 0 points  Count back from 20 0 points 0 points  Months in reverse 0 points 0 points  Repeat phrase 0 points 0 points  Total Score 0 points 0 points    Immunizations Immunization History  Administered Date(s) Administered   Fluad Quad(high Dose 65+) 04/21/2020, 05/25/2021, 05/22/2022   Moderna Covid-19 Vaccine Bivalent Booster 30yr & up 07/12/2021   Moderna SARS-COV2 Booster Vaccination 01/09/2021   Moderna Sars-Covid-2 Vaccination 10/02/2019, 10/30/2019, 07/01/2020   Pneumococcal Conjugate-13 06/25/2014   Pneumococcal Polysaccharide-23 06/09/2013   Tdap 08/22/2010, 04/21/2020   Zoster, Live 08/20/2012    TDAP status: Up to date  Flu Vaccine status: Up to date  Pneumococcal vaccine status: Up to date  Covid-19 vaccine status: Information provided on how to obtain vaccines.   Qualifies for Shingles Vaccine? Yes   Zostavax completed No   Shingrix Completed?: No.    Education has been provided regarding the importance of this vaccine. Patient has been advised to call insurance company to determine out of pocket expense if they have not yet received this vaccine. Advised may also receive vaccine at local pharmacy or Health Dept. Verbalized acceptance and understanding.  Screening Tests Health Maintenance  Topic Date Due   Zoster Vaccines- Shingrix (1 of 2) Never done   COVID-19 Vaccine (5 - Moderna risk series) 09/06/2021   Medicare Annual Wellness (AWV)  05/25/2022   TETANUS/TDAP  04/21/2030   Pneumonia Vaccine 76 Years old  Completed   INFLUENZA VACCINE  Completed   Hepatitis C Screening  Completed   HPV VACCINES  Aged Out   COLONOSCOPY (Pts 45-480yrInsurance coverage  will need to be confirmed)  Discontinued    Health Maintenance  Health Maintenance Due  Topic Date Due   Zoster Vaccines- Shingrix (1 of 2) Never done   COVID-19 Vaccine (5 - Moderna risk series) 09/06/2021   Medicare Annual Wellness (AWV)  05/25/2022     Colorectal cancer screening: Type of screening: Colonoscopy. Completed 06/19/12.   Lung Cancer Screening: (Low Dose CT Chest recommended if Age 3-80 years, 30 pack-year currently smoking OR have quit w/in 15years.) does not qualify.   Additional Screening:  Vision Screening: Recommended annual ophthalmology exams for early detection of glaucoma and other disorders of the eye. Is the patient up to date with their annual eye exam?  Yes   Dental Screening: Recommended annual dental exams for proper oral hygiene  Community Resource Referral / Chronic Care Management: CRR required this visit?  No   CCM required this visit?  No      Plan:     I have personally reviewed and noted the following in the patient's chart:   Medical and social history Use of alcohol, tobacco or illicit drugs  Current medications and supplements including opioid prescriptions. Patient is not currently taking opioid prescriptions. Functional ability and status Nutritional status Physical activity Advanced directives List of other physicians Hospitalizations, surgeries, and ER visits in previous 12 months Vitals Screenings to include cognitive, depression, and falls Referrals and appointments  In addition, I have reviewed and discussed with patient certain preventive protocols, quality metrics, and best practice recommendations. A written personalized care plan for preventive services as well as general preventive health recommendations were provided to patient.     Erie Noe, LPN   45/11/979

## 2022-06-25 ENCOUNTER — Telehealth: Payer: Self-pay | Admitting: Family Medicine

## 2022-06-25 ENCOUNTER — Other Ambulatory Visit: Payer: Self-pay

## 2022-06-25 MED ORDER — CARVEDILOL 6.25 MG PO TABS: 6.2500 mg | ORAL_TABLET | Freq: Two times a day (BID) | ORAL | 1 refills | Status: DC

## 2022-06-25 MED ORDER — LISINOPRIL 40 MG PO TABS: 40.0000 mg | ORAL_TABLET | Freq: Two times a day (BID) | ORAL | 1 refills | Status: DC

## 2022-06-25 MED ORDER — NITROGLYCERIN 0.4 MG SL SUBL: 0.4000 mg | SUBLINGUAL_TABLET | SUBLINGUAL | 2 refills | Status: DC | PRN

## 2022-06-25 NOTE — Progress Notes (Signed)
    Chronic Care Management Pharmacy Assistant   Name: Terryon Pineiro  MRN: [id number] DOB: 02-23-1946   Reason for Encounter: Medication Coordination for [facility]    Recent office visits:  06/21/22 Rhae Hammock LPN. Seen for Medicare Annual Wellness. No med changes.  05/22/22 Rochel Brome MD. Seen for routine visit. No med changes.  Recent consult visits:  None  Hospital visits:  None  Medications: Outpatient Encounter Medications as of 06/25/2022  Medication Sig   amLODipine (NORVASC) 10 MG tablet Take 1 tablet (10 mg total) by mouth daily.   aspirin EC 81 MG tablet Take 81 mg by mouth daily. Swallow whole.   Boswellia-Glucosamine-Vit D (OSTEO BI-FLEX ONE PER DAY PO) Take 1 tablet by mouth daily.   carvedilol (COREG) 6.25 MG tablet Take 1 tablet (6.25 mg total) by mouth 2 (two) times daily with a meal.   cetirizine (ZYRTEC) 10 MG tablet Take 10 mg by mouth daily.   co-enzyme Q-10 30 MG capsule Take 100 mg by mouth 3 (three) times daily.   fenofibrate 160 MG tablet Take 1 tablet (160 mg total) by mouth daily.   lisinopril (ZESTRIL) 40 MG tablet TAKE 1 TABLET TWICE A DAY   Misc Natural Products (PROSTATE SUPPORT) 300-15 MG TABS Take 1 tablet by mouth daily.   nitroGLYCERIN (NITROSTAT) 0.4 MG SL tablet Place 1 tablet (0.4 mg total) under the tongue every 5 (five) minutes as needed for chest pain.   rosuvastatin (CRESTOR) 20 MG tablet Take 1 tablet (20 mg total) by mouth daily.   No facility-administered encounter medications on file as of 06/25/2022.    Reviewed chart for medication changes ahead of medication coordination call.  No Consults, or hospital visits since last care coordination call/Pharmacist visit.   No medication changes indicated OR if recent visit, treatment plan here.  BP Readings from Last 3 Encounters:  06/22/22 124/68  05/22/22 130/60  02/16/22 120/60    Lab Results  Component Value Date   HGBA1C 5.9 (H) 05/18/2022     Patient obtains  medications through Vials  90 Days    Patient is due for his first adherence delivery on: 07/05/22. Called patient and reviewed medications and coordinated delivery.  This delivery to include: Amlodipine '10mg'$ -1 tab once daily  Lisinopril '40mg'$ -Take 1 tablet twice daily  Rosuvastatin '20mg'$ -1 tab once daily  Fenofibrate '160mg'$ -1 tab once daily   Patient declined the following medications  Carvedilol 6.'25mg'$ - Pt has been sent higher dose and lower dose and has an abundant supply that will last the rest of the year Nitrogylcerin 0.'4mg'$ -Only prn, has plenty Aspirin '81mg'$ - Gets OTC  Patient needs refills-Request sent  Carvedilol 6.'25mg'$  Lisinopril '40mg'$  Nitrogylcerin 0.'4mg'$    Confirmed delivery date of 07/05/22, advised patient that pharmacy will contact them the morning of delivery.   [name], [place] Pharmacist Assistant  [phone]

## 2022-09-21 ENCOUNTER — Telehealth: Payer: Self-pay

## 2022-09-21 NOTE — Progress Notes (Unsigned)
Care Management & Coordination Services Pharmacy Team  Reason for Encounter: Medication coordination and delivery  Contacted patient to discuss medications and coordinate delivery from [facility]. {US HC Outreach:28874} Cycle dispensing form sent to *** for review.   Last adherence delivery date:07/05/22      Patient is due for next adherence delivery on: 10/03/22  This delivery to include: Vials  90 Days  Amlodipine '10mg'$ -1 tab once daily     Carvedilol 6.'25mg'$ - Take 1 tablet (6.25 mg total) by mouth 2 (two) times daily with a meal.  Lisinopril '40mg'$ -Take 1 tablet twice daily  Rosuvastatin '20mg'$ -1 tab once daily   Fenofibrate '160mg'$ -1 tab once daily     Patient declined the following medications this month: Nitrogylcerin 0.'4mg'$ -Only prn, has plenty Aspirin '81mg'$ - Gets OTC  Refills requested from providers include: Amlodipine '10mg'$  Fenofibrate '160mg'$   {Delivery date:25786}   Any concerns about your medications? {yes/no:20286}  How often do you forget or accidentally miss a dose? {Missed doses:25554}  Do you use a pillbox? {yes/no:20286}  Is patient in packaging No  Recent blood pressure readings are as follows:05/22/22 130/60  Recent blood glucose readings are as follows:***   Chart review: Recent office visits:  None  Recent consult visits:  None  Hospital visits:  None  Medications: Outpatient Encounter Medications as of 09/21/2022  Medication Sig   amLODipine (NORVASC) 10 MG tablet Take 1 tablet (10 mg total) by mouth daily.   aspirin EC 81 MG tablet Take 81 mg by mouth daily. Swallow whole.   Boswellia-Glucosamine-Vit D (OSTEO BI-FLEX ONE PER DAY PO) Take 1 tablet by mouth daily.   carvedilol (COREG) 6.25 MG tablet Take 1 tablet (6.25 mg total) by mouth 2 (two) times daily with a meal.   cetirizine (ZYRTEC) 10 MG tablet Take 10 mg by mouth daily.   co-enzyme Q-10 30 MG capsule Take 100 mg by mouth 3 (three) times daily.   fenofibrate 160 MG tablet Take 1  tablet (160 mg total) by mouth daily.   lisinopril (ZESTRIL) 40 MG tablet Take 1 tablet (40 mg total) by mouth 2 (two) times daily.   Misc Natural Products (PROSTATE SUPPORT) 300-15 MG TABS Take 1 tablet by mouth daily.   nitroGLYCERIN (NITROSTAT) 0.4 MG SL tablet Place 1 tablet (0.4 mg total) under the tongue every 5 (five) minutes as needed for chest pain.   rosuvastatin (CRESTOR) 20 MG tablet Take 1 tablet (20 mg total) by mouth daily.   No facility-administered encounter medications on file as of 09/21/2022.   BP Readings from Last 3 Encounters:  06/22/22 124/68  05/22/22 130/60  02/16/22 120/60    Pulse Readings from Last 3 Encounters:  06/22/22 68  05/22/22 67  02/16/22 65    Lab Results  Component Value Date/Time   HGBA1C 5.9 (H) 05/18/2022 09:54 AM   HGBA1C 5.9 (H) 02/16/2022 08:47 AM   Lab Results  Component Value Date   CREATININE 1.06 05/18/2022   BUN 19 05/18/2022   GFRNONAA 64 06/03/2020   GFRAA 74 06/03/2020   NA 139 05/18/2022   K 4.6 05/18/2022   CALCIUM 9.6 05/18/2022   CO2 21 05/18/2022     [name], [place] Clinical Pharmacist Assistant  [phone]

## 2022-09-24 ENCOUNTER — Other Ambulatory Visit: Payer: Self-pay

## 2022-09-24 MED ORDER — FENOFIBRATE 160 MG PO TABS: 160.0000 mg | ORAL_TABLET | Freq: Every day | ORAL | 0 refills | Status: DC

## 2022-09-24 MED ORDER — AMLODIPINE BESYLATE 10 MG PO TABS: 10.0000 mg | ORAL_TABLET | Freq: Every day | ORAL | 0 refills | Status: DC

## 2022-09-25 NOTE — Telephone Encounter (Signed)
Compliant on meds 

## 2022-12-17 ENCOUNTER — Other Ambulatory Visit: Payer: Self-pay | Admitting: Family Medicine

## 2022-12-21 ENCOUNTER — Other Ambulatory Visit: Payer: Self-pay

## 2022-12-21 ENCOUNTER — Telehealth: Payer: Self-pay

## 2022-12-21 MED ORDER — FENOFIBRATE 160 MG PO TABS: 160.0000 mg | ORAL_TABLET | Freq: Every day | ORAL | 0 refills | Status: DC

## 2022-12-21 MED ORDER — AMLODIPINE BESYLATE 10 MG PO TABS: 10.0000 mg | ORAL_TABLET | Freq: Every day | ORAL | 0 refills | Status: DC

## 2022-12-21 NOTE — Progress Notes (Addendum)
Care Management & Coordination Services Pharmacy Team  Reason for Encounter: Medication coordination and delivery  Contacted patient to discuss medications and coordinate delivery from [facility] pharmacy.  Spoke with family on 12/21/2022   Cycle dispensing form sent to Artelia Laroche for review.   Last adherence delivery date:10/04/22      Patient is due for next adherence delivery on: 01/02/23  This delivery to include: Vials  90 Days  Amlodipine 10mg -1 tab once daily    .    Rosuvastatin 20mg -1 tab once daily   Fenofibrate 160mg -1 tab once daily     Patient declined the following medications this month: Nitrogylcerin 0.4mg -Only prn, has plenty. Not expired until 03/13/23 Aspirin 81mg - Gets OTC Carvedilol 6.25mg -Pt was getting oversupply of medication through [facility] and Dr. Algis Downs to cut Carvedilol 12.5 mg in half so pt still has plenty to last for a while  Lisinopril 40mg -Pt found another bottle that he has not started using yet. Pt didn't know they had extra supply so they do not need this at this time   Refills requested from providers include: Amlodipine 10mg  Fenofibrate 160mg   Confirmed delivery date of 01/02/23, advised patient that pharmacy will contact them the morning of delivery.  Any concerns about your medications? No  How often do you forget or accidentally miss a dose? Rarely  Do you use a pillbox? Yes  Is patient in packaging No  Recent blood pressure readings are as follows: 154/68 66 after working out in the yard and he rested and took it again it was 137/70 61  Recent blood glucose readings are as follows: Pt is not checking sugars at home   Chart review: Recent office visits:  None  Recent consult visits:  None  Hospital visits:  None  Medications: Outpatient Encounter Medications as of 12/21/2022  Medication Sig   amLODipine (NORVASC) 10 MG tablet Take 1 tablet (10 mg total) by mouth daily.   aspirin EC 81 MG tablet Take 81 mg by mouth  daily. Swallow whole.   Boswellia-Glucosamine-Vit D (OSTEO BI-FLEX ONE PER DAY PO) Take 1 tablet by mouth daily.   carvedilol (COREG) 6.25 MG tablet Take 1 tablet (6.25 mg total) by mouth 2 (two) times daily with a meal.   cetirizine (ZYRTEC) 10 MG tablet Take 10 mg by mouth daily.   co-enzyme Q-10 30 MG capsule Take 100 mg by mouth 3 (three) times daily.   fenofibrate 160 MG tablet Take 1 tablet (160 mg total) by mouth daily.   lisinopril (ZESTRIL) 40 MG tablet TAKE ONE TABLET BY MOUTH TWICE DAILY   Misc Natural Products (PROSTATE SUPPORT) 300-15 MG TABS Take 1 tablet by mouth daily.   nitroGLYCERIN (NITROSTAT) 0.4 MG SL tablet Place 1 tablet (0.4 mg total) under the tongue every 5 (five) minutes as needed for chest pain.   rosuvastatin (CRESTOR) 20 MG tablet TAKE ONE TABLET BY MOUTH ONCE DAILY   No facility-administered encounter medications on file as of 12/21/2022.   BP Readings from Last 3 Encounters:  06/22/22 124/68  05/22/22 130/60  02/16/22 120/60    Pulse Readings from Last 3 Encounters:  06/22/22 68  05/22/22 67  02/16/22 65    Lab Results  Component Value Date/Time   HGBA1C 5.9 (H) 05/18/2022 09:54 AM   HGBA1C 5.9 (H) 02/16/2022 08:47 AM   Lab Results  Component Value Date   CREATININE 1.06 05/18/2022   BUN 19 05/18/2022   GFRNONAA 64 06/03/2020   GFRAA 74 06/03/2020   NA  139 05/18/2022   K 4.6 05/18/2022   CALCIUM 9.6 05/18/2022   CO2 21 05/18/2022     Roxana Hires, CMA Clinical Pharmacist Assistant  [phone]

## 2022-12-24 NOTE — Telephone Encounter (Cosign Needed)
Sent the pharmacy a request for transfer of the Carvedilol   Roxana Hires, [facility] Clinical Pharmacist Assistant  [phone]

## 2022-12-27 ENCOUNTER — Other Ambulatory Visit: Payer: PPO

## 2022-12-27 DIAGNOSIS — R7303 Prediabetes: Secondary | ICD-10-CM | POA: Diagnosis not present

## 2022-12-27 DIAGNOSIS — E782 Mixed hyperlipidemia: Secondary | ICD-10-CM

## 2022-12-27 DIAGNOSIS — I119 Hypertensive heart disease without heart failure: Secondary | ICD-10-CM

## 2022-12-28 LAB — CBC WITH DIFFERENTIAL/PLATELET
Basophils Absolute: 0.1 10*3/uL (ref 0.0–0.2)
Basos: 1 %
EOS (ABSOLUTE): 0.6 10*3/uL — ABNORMAL HIGH (ref 0.0–0.4)
Eos: 9 %
Hematocrit: 42.7 % (ref 37.5–51.0)
Hemoglobin: 14.1 g/dL (ref 13.0–17.7)
Immature Grans (Abs): 0 10*3/uL (ref 0.0–0.1)
Immature Granulocytes: 0 %
Lymphocytes Absolute: 1.2 10*3/uL (ref 0.7–3.1)
Lymphs: 17 %
MCH: 29.5 pg (ref 26.6–33.0)
MCHC: 33 g/dL (ref 31.5–35.7)
MCV: 89 fL (ref 79–97)
Monocytes Absolute: 0.7 10*3/uL (ref 0.1–0.9)
Monocytes: 10 %
Neutrophils Absolute: 4.7 10*3/uL (ref 1.4–7.0)
Neutrophils: 63 %
Platelets: 232 10*3/uL (ref 150–450)
RBC: 4.78 x10E6/uL (ref 4.14–5.80)
RDW: 12.7 % (ref 11.6–15.4)
WBC: 7.3 10*3/uL (ref 3.4–10.8)

## 2022-12-28 LAB — CARDIOVASCULAR RISK ASSESSMENT

## 2022-12-28 LAB — COMPREHENSIVE METABOLIC PANEL
ALT: 26 IU/L (ref 0–44)
AST: 28 IU/L (ref 0–40)
Albumin/Globulin Ratio: 2 (ref 1.2–2.2)
Albumin: 4.5 g/dL (ref 3.8–4.8)
Alkaline Phosphatase: 51 IU/L (ref 44–121)
BUN/Creatinine Ratio: 20 (ref 10–24)
BUN: 20 mg/dL (ref 8–27)
Bilirubin Total: 0.3 mg/dL (ref 0.0–1.2)
CO2: 20 mmol/L (ref 20–29)
Calcium: 9.9 mg/dL (ref 8.6–10.2)
Chloride: 101 mmol/L (ref 96–106)
Creatinine, Ser: 1.01 mg/dL (ref 0.76–1.27)
Globulin, Total: 2.3 g/dL (ref 1.5–4.5)
Glucose: 85 mg/dL (ref 70–99)
Potassium: 4.7 mmol/L (ref 3.5–5.2)
Sodium: 138 mmol/L (ref 134–144)
Total Protein: 6.8 g/dL (ref 6.0–8.5)
eGFR: 76 mL/min/{1.73_m2} (ref >59)

## 2022-12-28 LAB — LIPID PANEL
Chol/HDL Ratio: 3.5 ratio (ref 0.0–5.0)
Cholesterol, Total: 131 mg/dL (ref 100–199)
HDL: 37 mg/dL — ABNORMAL LOW (ref >39)
LDL Chol Calc (NIH): 76 mg/dL (ref 0–99)
Triglycerides: 93 mg/dL (ref 0–149)
VLDL Cholesterol Cal: 18 mg/dL (ref 5–40)

## 2022-12-28 LAB — HEMOGLOBIN A1C
Est. average glucose Bld gHb Est-mCnc: 117 mg/dL
Hgb A1c MFr Bld: 5.7 % — ABNORMAL HIGH (ref 4.8–5.6)

## 2022-12-31 ENCOUNTER — Ambulatory Visit (INDEPENDENT_AMBULATORY_CARE_PROVIDER_SITE_OTHER): Payer: PPO | Admitting: Family Medicine

## 2022-12-31 ENCOUNTER — Encounter: Payer: Self-pay | Admitting: Family Medicine

## 2022-12-31 VITALS — BP 136/60 | HR 72 | Temp 97.1°F | Resp 16 | Ht 69.0 in | Wt 187.0 lb

## 2022-12-31 DIAGNOSIS — I2583 Coronary atherosclerosis due to lipid rich plaque: Secondary | ICD-10-CM

## 2022-12-31 DIAGNOSIS — I119 Hypertensive heart disease without heart failure: Secondary | ICD-10-CM | POA: Diagnosis not present

## 2022-12-31 DIAGNOSIS — E782 Mixed hyperlipidemia: Secondary | ICD-10-CM

## 2022-12-31 DIAGNOSIS — M25512 Pain in left shoulder: Secondary | ICD-10-CM | POA: Diagnosis not present

## 2022-12-31 DIAGNOSIS — M545 Low back pain, unspecified: Secondary | ICD-10-CM

## 2022-12-31 DIAGNOSIS — I2510 Atherosclerotic heart disease of native coronary artery without angina pectoris: Secondary | ICD-10-CM

## 2022-12-31 NOTE — Patient Instructions (Signed)
Hold crestor for 3 weeks and the fenofibrate.  Call back with findings.

## 2022-12-31 NOTE — Progress Notes (Signed)
Subjective:  Patient ID: [name], male    DOB: 1945-09-16  Age: 77 y.o. MRN: [id number]  Chief Complaint  Patient presents with   Medical Management of Chronic Issues    HPI   Hypertensive heart disease: currently taking Amlodipine 10 mg daily, Aspirin 81 mg daily, Carvedilol 6.25 mg twice a day, Lisinopril 40 mg twice a day.   Hyperlipidemia: taking Rosuvastatin 20 mg daily, CoQ10 100 mg daily and Fenofibrate 160 mg daily.   Low fat diet and active in yard work.   Arm pain: left. Similar to previous cardiac event except is now worse at night when he lays on the left side and improves with exercise. No chest pain. No shortness of breath.        12/31/2022    2:20 PM 06/22/2022    9:36 AM 05/22/2022    2:11 PM 07/12/2021    8:25 AM 05/25/2021    3:42 PM  Depression screen PHQ 2/9  Decreased Interest 0 0 0 0 0  Down, Depressed, Hopeless 0 0 0 0 0  PHQ - 2 Score 0 0 0 0 0  Altered sleeping 0 0 0    Tired, decreased energy 0 0 0    Change in appetite 0 0 0    Feeling bad or failure about yourself  0 0 0    Trouble concentrating 0 0 0    Moving slowly or fidgety/restless 0 0 0    Suicidal thoughts 0 0 0    PHQ-9 Score 0 0 0    Difficult doing work/chores Not difficult at all Not difficult at all Not difficult at all          12/31/2022    2:20 PM  Fall Risk   Falls in the past year? 0  Number falls in past yr: 0  Injury with Fall? 0  Risk for fall due to : No Fall Risks  Follow up Falls evaluation completed;Falls prevention discussed    Patient Care Team: Blane Ohara, MD as PCP - General (Family Medicine) Georgeanna Lea, MD as Consulting Physician (Cardiology) Zettie Pho, [facility] (Pharmacist)   Review of Systems  Constitutional:  Negative for chills and fever.  HENT:  Negative for congestion, rhinorrhea and sore throat.   Respiratory:  Negative for cough and shortness of breath.   Cardiovascular:  Negative for chest pain and palpitations.   Gastrointestinal:  Negative for abdominal pain, constipation, diarrhea, nausea and vomiting.  Genitourinary:  Negative for dysuria and urgency.  Musculoskeletal:  Positive for back pain and myalgias. Negative for arthralgias.  Neurological:  Negative for dizziness and headaches.  Psychiatric/Behavioral:  Negative for dysphoric mood. The patient is not nervous/anxious.     Current Outpatient Medications on File Prior to Visit  Medication Sig Dispense Refill   amLODipine (NORVASC) 10 MG tablet Take 1 tablet (10 mg total) by mouth daily. 90 tablet 0   aspirin EC 81 MG tablet Take 81 mg by mouth daily. Swallow whole.     Boswellia-Glucosamine-Vit D (OSTEO BI-FLEX ONE PER DAY PO) Take 1 tablet by mouth daily.     carvedilol (COREG) 6.25 MG tablet Take 1 tablet (6.25 mg total) by mouth 2 (two) times daily with a meal. 180 tablet 1   cetirizine (ZYRTEC) 10 MG tablet Take 10 mg by mouth daily.     co-enzyme Q-10 30 MG capsule Take 100 mg by mouth daily.     fenofibrate 160 MG tablet Take 1 tablet (160  mg total) by mouth daily. 90 tablet 0   lisinopril (ZESTRIL) 40 MG tablet TAKE ONE TABLET BY MOUTH TWICE DAILY 180 tablet 0   Misc Natural Products (PROSTATE SUPPORT) 300-15 MG TABS Take 1 tablet by mouth daily.     nitroGLYCERIN (NITROSTAT) 0.4 MG SL tablet Place 1 tablet (0.4 mg total) under the tongue every 5 (five) minutes as needed for chest pain. 25 tablet 2   rosuvastatin (CRESTOR) 20 MG tablet TAKE ONE TABLET BY MOUTH ONCE DAILY 90 tablet 0   No current facility-administered medications on file prior to visit.   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      Family History  Problem Relation Age of Onset   Dementia Mother    Cancer Father        pancreatic   Hypertension Other    Hyperlipidemia Other    Social History   Socioeconomic History   Marital status: Married    Spouse name: Harriett Sine   Number of children: Not on file   Years of education: Not on file   Highest education level: Not on file  Occupational History   Not on file  Tobacco Use   Smoking status: Former    Types: Cigarettes   Smokeless tobacco: Never  Vaping Use   Vaping Use: Never used  Substance and Sexual Activity   Alcohol use: Never   Drug use: Never   Sexual activity: Not on file  Other Topics Concern   Not on file  Social History Narrative   Not on file   Social Determinants of Health   Financial Resource Strain: Low Risk  (04/26/2022)   Overall Financial Resource Strain (CARDIA)    Difficulty of Paying Living Expenses: Not hard at all  Food Insecurity: No Food Insecurity (09/14/2020)   Hunger Vital Sign    Worried About Running Out of Food in the Last Year: Never true    Ran Out of Food in the Last Year: Never true  Transportation Needs: No Transportation Needs (04/26/2022)   PRAPARE - Administrator, Civil Service (Medical): No    Lack of Transportation (Non-Medical): No  Physical Activity: Inactive (05/22/2022)   Exercise Vital Sign    Days of Exercise per Week: 0 days    Minutes of Exercise per Session: 0 min  Stress: No Stress Concern Present (05/22/2022)   [employer] of Occupational Health - Occupational Stress Questionnaire    Feeling of Stress : Not at all  Social Connections: Moderately Integrated (05/22/2022)   Social Connection and Isolation Panel [NHANES]    Frequency of Communication with Friends and Family: More than three times a week    Frequency of Social Gatherings with Friends and Family: More than three times a week    Attends Religious Services: More than 4 times per year    Active Member of [employer]  or Organizations: No    Attends Banker Meetings: Never    Marital Status: Married    Objective:  BP 136/60   Pulse 72  Temp (!) 97.1 F (36.2 C)   Resp 16   Ht 5\' 9"  (1.753 m)   Wt 187 lb (84.8 kg)   BMI 27.62 kg/m      12/31/2022    2:18 PM 06/22/2022    9:23 AM 05/22/2022    2:10 PM  BP/Weight  Systolic BP 136 124 130  Diastolic BP 60 68 60  Wt. (Lbs) 187 184 189  BMI 27.62 kg/m2 27.17 kg/m2 27.91 kg/m2    Physical Exam Vitals reviewed.  Constitutional:      Appearance: Normal appearance.  Neck:     Vascular: No carotid bruit.  Cardiovascular:     Rate and Rhythm: Normal rate and regular rhythm.     Pulses: Normal pulses.     Heart sounds: Normal heart sounds.  Pulmonary:     Effort: Pulmonary effort is normal.     Breath sounds: Normal breath sounds. No wheezing, rhonchi or rales.  Abdominal:     General: Bowel sounds are normal.     Palpations: Abdomen is soft.     Tenderness: There is no abdominal tenderness.  Musculoskeletal:        General: No tenderness (none in  left arm with palpation or rom .nontender over chest wall.). Normal range of motion.  Neurological:     Mental Status: He is alert and oriented to person, place, and time.  Psychiatric:        Mood and Affect: Mood normal.        Behavior: Behavior normal.     Diabetic Foot Exam - Simple   No data filed      Lab Results  Component Value Date   WBC 7.3 12/27/2022   HGB 14.1 12/27/2022   HCT 42.7 12/27/2022   PLT 232 12/27/2022   GLUCOSE 85 12/27/2022   CHOL 131 12/27/2022   TRIG 93 12/27/2022   HDL 37 (L) 12/27/2022   LDLCALC 76 12/27/2022   ALT 26 12/27/2022   AST 28 12/27/2022   NA 138 12/27/2022   K 4.7 12/27/2022   CL 101 12/27/2022   CREATININE 1.01 12/27/2022   BUN 20 12/27/2022   CO2 20 12/27/2022   TSH 1.950 10/17/2021   INR 0.96 08/22/2010   HGBA1C 5.7 (H) 12/27/2022      Assessment & Plan:    Mixed hyperlipidemia Assessment & Plan: Well  controlled.  No changes to medicines. Hold Rosuvastatin 20 mg daily, CoQ10 100 mg daily and Fenofibrate 160 mg daily. See if pain resolves. Call back in 3-4 weeks to let us know how he is doing.  Continue to work on eating a healthy diet and exercise.  Labs drawn today.     Hypertensive heart disease without heart failure Assessment & Plan: Well controlled.  No changes to medicines. Continue Amlodipine 10 mg daily, Aspirin 81 mg daily, Carvedilol 6.25 mg twice a day, Lisinopril 40 mg twice a day. Continue to work on eating a healthy diet and exercise.  Labs reviewed   Coronary artery disease due to lipid rich plaque Assessment & Plan: EKG NSR. No ST changes.  Hold statins to see if pain resolves.    Lumbar back pain Assessment & Plan: Recommend tylenol. See if improves with stopping statin.    Acute pain of left shoulder Assessment & Plan: EKG NSR, No ST changes.  See if improves with discontinuation of statin.   Orders: -     EKG 12-Lead     No orders of the defined types  were placed in this encounter.   Orders Placed This Encounter  Procedures   EKG 12-Lead     Follow-up: Return in about 4 months (around 05/03/2023) for chronic follow up.   I,Carolyn M Morrison,acting as a Neurosurgeon for Blane Ohara, MD.,have documented all relevant documentation on the behalf of Blane Ohara, MD,as directed by  Blane Ohara, MD while in the presence of Blane Ohara, MD.   An After Visit Summary was printed and given to the patient.  I attest that I have reviewed this visit and agree with the plan scribed by my staff.   Blane Ohara, MD [facility] [phone]

## 2023-01-04 DIAGNOSIS — M25512 Pain in left shoulder: Secondary | ICD-10-CM | POA: Insufficient documentation

## 2023-01-04 DIAGNOSIS — M545 Low back pain, unspecified: Secondary | ICD-10-CM | POA: Insufficient documentation

## 2023-01-04 NOTE — Assessment & Plan Note (Signed)
Well controlled.  No changes to medicines. Continue Amlodipine 10 mg daily, Aspirin 81 mg daily, Carvedilol 6.25 mg twice a day, Lisinopril 40 mg twice a day. Continue to work on eating a healthy diet and exercise.  Labs reviewed

## 2023-01-04 NOTE — Assessment & Plan Note (Signed)
Recommend tylenol. See if improves with stopping statin.

## 2023-01-04 NOTE — Assessment & Plan Note (Signed)
EKG NSR. No ST changes.  Hold statins to see if pain resolves.

## 2023-01-04 NOTE — Assessment & Plan Note (Addendum)
Well controlled.  No changes to medicines. Hold Rosuvastatin 20 mg daily, CoQ10 100 mg daily and Fenofibrate 160 mg daily. See if pain resolves. Call back in 3-4 weeks to let us know how he is doing.  Continue to work on eating a healthy diet and exercise.  Labs drawn today.

## 2023-01-04 NOTE — Assessment & Plan Note (Signed)
EKG NSR, No ST changes.  See if improves with discontinuation of statin.

## 2023-01-23 ENCOUNTER — Other Ambulatory Visit: Payer: Self-pay

## 2023-01-23 MED ORDER — ROSUVASTATIN CALCIUM 20 MG PO TABS: 20.0000 mg | ORAL_TABLET | Freq: Every day | ORAL | 0 refills | Status: DC

## 2023-01-23 MED ORDER — FENOFIBRATE 160 MG PO TABS: 160.0000 mg | ORAL_TABLET | Freq: Every day | ORAL | 0 refills | Status: DC

## 2023-04-01 ENCOUNTER — Other Ambulatory Visit: Payer: Self-pay | Admitting: Family Medicine

## 2023-04-29 ENCOUNTER — Other Ambulatory Visit: Payer: Self-pay

## 2023-04-29 DIAGNOSIS — E782 Mixed hyperlipidemia: Secondary | ICD-10-CM

## 2023-04-29 DIAGNOSIS — R7303 Prediabetes: Secondary | ICD-10-CM

## 2023-04-29 DIAGNOSIS — I119 Hypertensive heart disease without heart failure: Secondary | ICD-10-CM

## 2023-04-30 ENCOUNTER — Other Ambulatory Visit: Payer: PPO

## 2023-04-30 DIAGNOSIS — I119 Hypertensive heart disease without heart failure: Secondary | ICD-10-CM

## 2023-04-30 DIAGNOSIS — E782 Mixed hyperlipidemia: Secondary | ICD-10-CM

## 2023-04-30 DIAGNOSIS — R7303 Prediabetes: Secondary | ICD-10-CM | POA: Diagnosis not present

## 2023-05-01 LAB — CBC WITH DIFFERENTIAL/PLATELET
Basophils Absolute: 0.1 10*3/uL (ref 0.0–0.2)
Basos: 1 %
EOS (ABSOLUTE): 0.4 10*3/uL (ref 0.0–0.4)
Eos: 6 %
Hematocrit: 44.1 % (ref 37.5–51.0)
Hemoglobin: 13.9 g/dL (ref 13.0–17.7)
Immature Grans (Abs): 0 10*3/uL (ref 0.0–0.1)
Immature Granulocytes: 0 %
Lymphocytes Absolute: 1.2 10*3/uL (ref 0.7–3.1)
Lymphs: 20 %
MCH: 29.3 pg (ref 26.6–33.0)
MCHC: 31.5 g/dL (ref 31.5–35.7)
MCV: 93 fL (ref 79–97)
Monocytes Absolute: 0.6 10*3/uL (ref 0.1–0.9)
Monocytes: 9 %
Neutrophils Absolute: 4 10*3/uL (ref 1.4–7.0)
Neutrophils: 64 %
Platelets: 240 10*3/uL (ref 150–450)
RBC: 4.75 x10E6/uL (ref 4.14–5.80)
RDW: 13.1 % (ref 11.6–15.4)
WBC: 6.3 10*3/uL (ref 3.4–10.8)

## 2023-05-01 LAB — COMPREHENSIVE METABOLIC PANEL
ALT: 23 IU/L (ref 0–44)
AST: 27 IU/L (ref 0–40)
Albumin: 4.5 g/dL (ref 3.8–4.8)
Alkaline Phosphatase: 45 IU/L (ref 44–121)
BUN/Creatinine Ratio: 22 (ref 10–24)
BUN: 24 mg/dL (ref 8–27)
Bilirubin Total: 0.3 mg/dL (ref 0.0–1.2)
CO2: 23 mmol/L (ref 20–29)
Calcium: 9.7 mg/dL (ref 8.6–10.2)
Chloride: 105 mmol/L (ref 96–106)
Creatinine, Ser: 1.09 mg/dL (ref 0.76–1.27)
Globulin, Total: 2.3 g/dL (ref 1.5–4.5)
Glucose: 93 mg/dL (ref 70–99)
Potassium: 4.9 mmol/L (ref 3.5–5.2)
Sodium: 140 mmol/L (ref 134–144)
Total Protein: 6.8 g/dL (ref 6.0–8.5)
eGFR: 69 mL/min/{1.73_m2} (ref >59)

## 2023-05-01 LAB — LIPID PANEL
Chol/HDL Ratio: 3.9 ratio (ref 0.0–5.0)
Cholesterol, Total: 130 mg/dL (ref 100–199)
HDL: 33 mg/dL — ABNORMAL LOW (ref >39)
LDL Chol Calc (NIH): 76 mg/dL (ref 0–99)
Triglycerides: 114 mg/dL (ref 0–149)
VLDL Cholesterol Cal: 21 mg/dL (ref 5–40)

## 2023-05-01 LAB — HEMOGLOBIN A1C
Est. average glucose Bld gHb Est-mCnc: 128 mg/dL
Hgb A1c MFr Bld: 6.1 % — ABNORMAL HIGH (ref 4.8–5.6)

## 2023-05-02 ENCOUNTER — Encounter: Payer: Self-pay | Admitting: Family Medicine

## 2023-05-02 ENCOUNTER — Ambulatory Visit (INDEPENDENT_AMBULATORY_CARE_PROVIDER_SITE_OTHER): Payer: PPO | Admitting: Family Medicine

## 2023-05-02 VITALS — BP 120/50 | HR 72 | Temp 97.2°F | Resp 16 | Ht 66.0 in | Wt 187.6 lb

## 2023-05-02 DIAGNOSIS — E782 Mixed hyperlipidemia: Secondary | ICD-10-CM

## 2023-05-02 DIAGNOSIS — I119 Hypertensive heart disease without heart failure: Secondary | ICD-10-CM | POA: Diagnosis not present

## 2023-05-02 DIAGNOSIS — Z23 Encounter for immunization: Secondary | ICD-10-CM | POA: Diagnosis not present

## 2023-05-02 DIAGNOSIS — Z6830 Body mass index (BMI) 30.0-30.9, adult: Secondary | ICD-10-CM

## 2023-05-02 DIAGNOSIS — E6609 Other obesity due to excess calories: Secondary | ICD-10-CM

## 2023-05-02 DIAGNOSIS — M25512 Pain in left shoulder: Secondary | ICD-10-CM

## 2023-05-02 DIAGNOSIS — R7303 Prediabetes: Secondary | ICD-10-CM | POA: Diagnosis not present

## 2023-05-02 NOTE — Progress Notes (Unsigned)
Subjective:  Patient ID: Bryan Zimmerman, male    DOB: 1945-10-08  Age: 77 y.o. MRN: [id number]  Chief Complaint  Patient presents with   Medical Management of Chronic Issues    HPI Hypertensive heart disease: currently taking Amlodipine 10 mg daily, Aspirin 81 mg daily, Carvedilol 6.25 mg twice a day, Lisinopril 40 mg twice a day.   Hyperlipidemia: taking Rosuvastatin 20 mg daily, CoQ10 100 mg daily and Fenofibrate 160 mg daily.   Low fat diet and active in yard work.   Arm pain: left shoulder pain. No shortness of breath.       05/02/2023    2:53 PM 12/31/2022    2:20 PM 06/22/2022    9:36 AM 05/22/2022    2:11 PM 07/12/2021    8:25 AM  Depression screen PHQ 2/9  Decreased Interest 0 0 0 0 0  Down, Depressed, Hopeless 0 0 0 0 0  PHQ - 2 Score 0 0 0 0 0  Altered sleeping 0 0 0 0   Tired, decreased energy 0 0 0 0   Change in appetite 0 0 0 0   Feeling bad or failure about yourself  0 0 0 0   Trouble concentrating 0 0 0 0   Moving slowly or fidgety/restless 0 0 0 0   Suicidal thoughts 0 0 0 0   PHQ-9 Score 0 0 0 0   Difficult doing work/chores Not difficult at all Not difficult at all Not difficult at all Not difficult at all         05/02/2023    2:53 PM  Fall Risk   Falls in the past year? 0  Number falls in past yr: 0  Injury with Fall? 0  Risk for fall due to : No Fall Risks  Follow up Falls evaluation completed;Falls prevention discussed    Patient Care Team: Blane Ohara, MD as PCP - General (Family Medicine) Georgeanna Lea, MD as Consulting Physician (Cardiology) Zettie Pho, [facility] (Inactive) (Pharmacist)   Review of Systems  Constitutional:  Negative for chills and fever.  HENT:  Negative for congestion, rhinorrhea and sore throat.   Respiratory:  Negative for cough and shortness of breath.   Cardiovascular:  Negative for chest pain and palpitations.  Gastrointestinal:  Negative for abdominal pain, constipation, diarrhea, nausea and vomiting.   Genitourinary:  Negative for dysuria and urgency.  Musculoskeletal:  Positive for arthralgias (left shoulder pain) and myalgias. Negative for back pain.  Neurological:  Negative for dizziness and headaches.  Psychiatric/Behavioral:  Negative for dysphoric mood. The patient is not nervous/anxious.     Current Outpatient Medications on File Prior to Visit  Medication Sig Dispense Refill   amLODipine (NORVASC) 10 MG tablet TAKE ONE TABLET BY MOUTH ONCE DAILY 90 tablet 1   aspirin EC 81 MG tablet Take 81 mg by mouth daily. Swallow whole.     carvedilol (COREG) 6.25 MG tablet Take 1 tablet (6.25 mg total) by mouth 2 (two) times daily with a meal. 180 tablet 1   cetirizine (ZYRTEC) 10 MG tablet Take 10 mg by mouth daily.     co-enzyme Q-10 30 MG capsule Take 100 mg by mouth daily.     fenofibrate 160 MG tablet TAKE ONE TABLET BY MOUTH ONCE DAILY 90 tablet 1   lisinopril (ZESTRIL) 40 MG tablet TAKE ONE TABLET BY MOUTH TWICE DAILY 180 tablet 1   Misc Natural Products (PROSTATE SUPPORT) 300-15 MG TABS Take 1 tablet by mouth daily.  nitroGLYCERIN (NITROSTAT) 0.4 MG SL tablet Place 1 tablet (0.4 mg total) under the tongue every 5 (five) minutes as needed for chest pain. 25 tablet 2   rosuvastatin (CRESTOR) 20 MG tablet TAKE ONE TABLET BY MOUTH ONCE DAILY 90 tablet 1   Boswellia-Glucosamine-Vit D (OSTEO BI-FLEX ONE PER DAY PO) Take 1 tablet by mouth daily. (Patient not taking: Reported on 05/02/2023)     No current facility-administered medications on file prior to visit.   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      Family History  Problem  Relation Age of Onset   Dementia Mother    Cancer Father        pancreatic   Hypertension Other    Hyperlipidemia Other    Social History   Socioeconomic History   Marital status: Married    Spouse name: Harriett Sine   Number of children: Not on file   Years of education: Not on file   Highest education level: Not on file  Occupational History   Not on file  Tobacco Use   Smoking status: Former    Types: Cigarettes   Smokeless tobacco: Never  Vaping Use   Vaping status: Never Used  Substance and Sexual Activity   Alcohol use: Never   Drug use: Never   Sexual activity: Not on file  Other Topics Concern   Not on file  Social History Narrative   Not on file   Social Determinants of Health   Financial Resource Strain: Low Risk  (04/26/2022)   Overall Financial Resource Strain (CARDIA)    Difficulty of Paying Living Expenses: Not hard at all  Food Insecurity: No Food Insecurity (09/14/2020)   Hunger Vital Sign    Worried About Running Out of Food in the Last Year: Never true    Ran Out of Food in the Last Year: Never true  Transportation Needs: No Transportation Needs (04/26/2022)   PRAPARE - Administrator, Civil Service (Medical): No    Lack of Transportation (Non-Medical): No  Physical Activity: Inactive (05/22/2022)   Exercise Vital Sign    Days of Exercise per Week: 0 days    Minutes of Exercise per Session: 0 min  Stress: No Stress Concern Present (05/22/2022)   [employer] of Occupational Health - Occupational Stress Questionnaire    Feeling of Stress : Not at all  Social Connections: Moderately Integrated (05/22/2022)   Social Connection and Isolation Panel [NHANES]    Frequency of Communication with Friends and Family: More than three times a week    Frequency of Social Gatherings with Friends and Family: More than three times a week    Attends Religious Services: More than 4 times per year    Active Member of Clubs or Organizations: No    Attends Occupational hygienist Meetings: Never    Marital Status: Married    Objective:  BP (!) 120/50   Pulse 72   Temp (!) 97.2 F (36.2 C)   Resp 16   Ht 5\' 6"  (1.676 m)  Wt 187 lb 9.6 oz (85.1 kg)   BMI 30.28 kg/m      05/02/2023    2:49 PM 12/31/2022    2:18 PM 06/22/2022    9:23 AM  BP/Weight  Systolic BP 120 136 124  Diastolic BP 50 60 68  Wt. (Lbs) 187.6 187 184  BMI 30.28 kg/m2 27.62 kg/m2 27.17 kg/m2    Physical Exam Vitals reviewed.  Constitutional:      Appearance: Normal appearance. He is obese.  Neck:     Vascular: No carotid bruit.  Cardiovascular:     Rate and Rhythm: Normal rate and regular rhythm.     Heart sounds: Normal heart sounds.  Pulmonary:     Effort: Pulmonary effort is normal.     Breath sounds: Normal breath sounds. No wheezing, rhonchi or rales.  Abdominal:     General: Bowel sounds are normal.     Palpations: Abdomen is soft.     Tenderness: There is no abdominal tenderness.  Musculoskeletal:     Comments: ROM - limited internal rotation.  Neurological:     Mental Status: He is alert.  Psychiatric:        Mood and Affect: Mood normal.        Behavior: Behavior normal.     Diabetic Foot Exam - Simple   No data filed      Lab Results  Component Value Date   WBC 6.3 04/30/2023   HGB 13.9 04/30/2023   HCT 44.1 04/30/2023   PLT 240 04/30/2023   GLUCOSE 93 04/30/2023   CHOL 130 04/30/2023   TRIG 114 04/30/2023   HDL 33 (L) 04/30/2023   LDLCALC 76 04/30/2023   ALT 23 04/30/2023   AST 27 04/30/2023   NA 140 04/30/2023   K 4.9 04/30/2023   CL 105 04/30/2023   CREATININE 1.09 04/30/2023   BUN 24 04/30/2023   CO2 23 04/30/2023   TSH 1.950 10/17/2021   INR 0.96 08/22/2010   HGBA1C 6.1 (H) 04/30/2023      Assessment & Plan:    Hypertensive heart disease without heart failure Assessment & Plan: Well controlled.  No changes to medicines. Continue Amlodipine 10 mg daily, Aspirin 81 mg daily, Carvedilol 6.25 mg twice a day,  Lisinopril 40 mg twice a day. Continue to work on eating a healthy diet and exercise.  Labs reviewed   Mixed hyperlipidemia Assessment & Plan: Well controlled.  No changes to medicines. Rosuvastatin 20 mg daily, CoQ10 100 mg daily and Fenofibrate 160 mg daily. Continue to work on eating a healthy diet and exercise.  Labs reviewed.    Prediabetes Assessment & Plan: Hemoglobin A1c 5.7%, 3 month avg of blood sugars, is in prediabetic range.  In order to prevent progression to diabetes, recommend low carb diet and regular exercise    Encounter for immunization -     Flu Vaccine Trivalent High Dose (Fluad) -     Pfizer Comirnaty Covid-19 Vaccine 35yrs & older  Class 1 obesity due to excess calories with serious comorbidity and body mass index (BMI) of 30.0 to 30.9 in adult Assessment & Plan: Recommend continue to work on eating healthy diet and exercise. Comorbidities: hyperlipidemia and hypertension.      No orders of the defined types were placed in this encounter.   Orders Placed This Encounter  Procedures   Flu Vaccine Trivalent High Dose (Fluad)   Pfizer Comirnaty Covid-19 Vaccine 19yrs & older     Follow-up: No follow-ups on file.  I,Carolyn M Morrison,acting as a Neurosurgeon for Blane Ohara, MD.,have documented all relevant documentation on the behalf of Blane Ohara, MD,as directed by  Blane Ohara, MD while in the presence of Blane Ohara, MD.   Clayborn Bigness I Leal-Borjas,acting as a scribe for Blane Ohara, MD.,have documented all relevant documentation on the behalf of Blane Ohara, MD,as directed by  Blane Ohara, MD while in the presence of Blane Ohara, MD.    An After Visit Summary was printed and given to the patient.  Blane Ohara, MD [facility] [phone]

## 2023-05-03 ENCOUNTER — Ambulatory Visit: Payer: PPO | Admitting: Family Medicine

## 2023-05-05 DIAGNOSIS — R7303 Prediabetes: Secondary | ICD-10-CM | POA: Insufficient documentation

## 2023-05-05 DIAGNOSIS — Z23 Encounter for immunization: Secondary | ICD-10-CM | POA: Insufficient documentation

## 2023-05-05 NOTE — Assessment & Plan Note (Signed)
Hemoglobin A1c 5.7%, 3 month avg of blood sugars, is in prediabetic range.  In order to prevent progression to diabetes, recommend low carb diet and regular exercise

## 2023-05-05 NOTE — Assessment & Plan Note (Signed)
Well controlled.  No changes to medicines. Rosuvastatin 20 mg daily, CoQ10 100 mg daily and Fenofibrate 160 mg daily. Continue to work on eating a healthy diet and exercise.  Labs reviewed.

## 2023-05-05 NOTE — Assessment & Plan Note (Signed)
Well controlled.  No changes to medicines. Continue Amlodipine 10 mg daily, Aspirin 81 mg daily, Carvedilol 6.25 mg twice a day, Lisinopril 40 mg twice a day. Continue to work on eating a healthy diet and exercise.  Labs reviewed

## 2023-05-06 DIAGNOSIS — E6609 Other obesity due to excess calories: Secondary | ICD-10-CM | POA: Insufficient documentation

## 2023-05-06 DIAGNOSIS — Z6829 Body mass index (BMI) 29.0-29.9, adult: Secondary | ICD-10-CM | POA: Insufficient documentation

## 2023-05-06 NOTE — Assessment & Plan Note (Signed)
Recommend continue to work on eating healthy diet and exercise. Comorbidities: hyperlipidemia and hypertension.

## 2023-06-26 ENCOUNTER — Ambulatory Visit (INDEPENDENT_AMBULATORY_CARE_PROVIDER_SITE_OTHER): Payer: PPO | Admitting: *Deleted

## 2023-06-26 DIAGNOSIS — Z0000 Encounter for general adult medical examination without abnormal findings: Secondary | ICD-10-CM

## 2023-06-26 NOTE — Patient Instructions (Signed)
Mr. Bryan Zimmerman , Thank you for taking time to come for your Medicare Wellness Visit. I appreciate your ongoing commitment to your health goals. Please review the following plan we discussed and let me know if I can assist you in the future.   Screening recommendations/referrals: Colonoscopy: no longer required Recommended yearly ophthalmology/optometry visit for glaucoma screening and checkup Recommended yearly dental visit for hygiene and checkup  Vaccinations: Influenza vaccine: up to date Pneumococcal vaccine: up to date Tdap vaccine: up to date Shingles vaccine:  1 of 2      Preventive Care 65 Years and Older, Male Preventive care refers to lifestyle choices and visits with your health care provider that can promote health and wellness. What does preventive care include? A yearly physical exam. This is also called an annual well check. Dental exams once or twice a year. Routine eye exams. Ask your health care provider how often you should have your eyes checked. Personal lifestyle choices, including: Daily care of your teeth and gums. Regular physical activity. Eating a healthy diet. Avoiding tobacco and drug use. Limiting alcohol use. Practicing safe sex. Taking low doses of aspirin every day. Taking vitamin and mineral supplements as recommended by your health care provider. What happens during an annual well check? The services and screenings done by your health care provider during your annual well check will depend on your age, overall health, lifestyle risk factors, and family history of disease. Counseling  Your health care provider may ask you questions about your: Alcohol use. Tobacco use. Drug use. Emotional well-being. Home and relationship well-being. Sexual activity. Eating habits. History of falls. Memory and ability to understand (cognition). Work and work Astronomer. Screening  You may have the following tests or measurements: Height, weight, and  BMI. Blood pressure. Lipid and cholesterol levels. These may be checked every 5 years, or more frequently if you are over 86 years old. Skin check. Lung cancer screening. You may have this screening every year starting at age 68 if you have a 30-pack-year history of smoking and currently smoke or have quit within the past 15 years. Fecal occult blood test (FOBT) of the stool. You may have this test every year starting at age 54. Flexible sigmoidoscopy or colonoscopy. You may have a sigmoidoscopy every 5 years or a colonoscopy every 10 years starting at age 98. Prostate cancer screening. Recommendations will vary depending on your family history and other risks. Hepatitis C blood test. Hepatitis B blood test. Sexually transmitted disease (STD) testing. Diabetes screening. This is done by checking your blood sugar (glucose) after you have not eaten for a while (fasting). You may have this done every 1-3 years. Abdominal aortic aneurysm (AAA) screening. You may need this if you are a current or former smoker. Osteoporosis. You may be screened starting at age 43 if you are at high risk. Talk with your health care provider about your test results, treatment options, and if necessary, the need for more tests. Vaccines  Your health care provider may recommend certain vaccines, such as: Influenza vaccine. This is recommended every year. Tetanus, diphtheria, and acellular pertussis (Tdap, Td) vaccine. You may need a Td booster every 10 years. Zoster vaccine. You may need this after age 40. Pneumococcal 13-valent conjugate (PCV13) vaccine. One dose is recommended after age 53. Pneumococcal polysaccharide (PPSV23) vaccine. One dose is recommended after age 61. Talk to your health care provider about which screenings and vaccines you need and how often you need them. This information is not  intended to replace advice given to you by your health care provider. Make sure you discuss any questions you have  with your health care provider. Document Released: 09/02/2015 Document Revised: 04/25/2016 Document Reviewed: 06/07/2015 Elsevier Interactive Patient Education  2017 [employer].  Fall Prevention in the Home Falls can cause injuries. They can happen to people of all ages. There are many things you can do to make your home safe and to help prevent falls. What can I do on the outside of my home? Regularly fix the edges of walkways and driveways and fix any cracks. Remove anything that might make you trip as you walk through a door, such as a raised step or threshold. Trim any bushes or trees on the path to your home. Use bright outdoor lighting. Clear any walking paths of anything that might make someone trip, such as rocks or tools. Regularly check to see if handrails are loose or broken. Make sure that both sides of any steps have handrails. Any raised decks and porches should have guardrails on the edges. Have any leaves, snow, or ice cleared regularly. Use sand or salt on walking paths during winter. Clean up any spills in your garage right away. This includes oil or grease spills. What can I do in the bathroom? Use night lights. Install grab bars by the toilet and in the tub and shower. Do not use towel bars as grab bars. Use non-skid mats or decals in the tub or shower. If you need to sit down in the shower, use a plastic, non-slip stool. Keep the floor dry. Clean up any water that spills on the floor as soon as it happens. Remove soap buildup in the tub or shower regularly. Attach bath mats securely with double-sided non-slip rug tape. Do not have throw rugs and other things on the floor that can make you trip. What can I do in the bedroom? Use night lights. Make sure that you have a light by your bed that is easy to reach. Do not use any sheets or blankets that are too big for your bed. They should not hang down onto the floor. Have a firm chair that has side arms. You can use  this for support while you get dressed. Do not have throw rugs and other things on the floor that can make you trip. What can I do in the kitchen? Clean up any spills right away. Avoid walking on wet floors. Keep items that you use a lot in easy-to-reach places. If you need to reach something above you, use a strong step stool that has a grab bar. Keep electrical cords out of the way. Do not use floor polish or wax that makes floors slippery. If you must use wax, use non-skid floor wax. Do not have throw rugs and other things on the floor that can make you trip. What can I do with my stairs? Do not leave any items on the stairs. Make sure that there are handrails on both sides of the stairs and use them. Fix handrails that are broken or loose. Make sure that handrails are as long as the stairways. Check any carpeting to make sure that it is firmly attached to the stairs. Fix any carpet that is loose or worn. Avoid having throw rugs at the top or bottom of the stairs. If you do have throw rugs, attach them to the floor with carpet tape. Make sure that you have a light switch at the top of the stairs  and the bottom of the stairs. If you do not have them, ask someone to add them for you. What else can I do to help prevent falls? Wear shoes that: Do not have high heels. Have rubber bottoms. Are comfortable and fit you well. Are closed at the toe. Do not wear sandals. If you use a stepladder: Make sure that it is fully opened. Do not climb a closed stepladder. Make sure that both sides of the stepladder are locked into place. Ask someone to hold it for you, if possible. Clearly mark and make sure that you can see: Any grab bars or handrails. First and last steps. Where the edge of each step is. Use tools that help you move around (mobility aids) if they are needed. These include: Canes. Walkers. Scooters. Crutches. Turn on the lights when you go into a dark area. Replace any light bulbs  as soon as they burn out. Set up your furniture so you have a clear path. Avoid moving your furniture around. If any of your floors are uneven, fix them. If there are any pets around you, be aware of where they are. Review your medicines with your doctor. Some medicines can make you feel dizzy. This can increase your chance of falling. Ask your doctor what other things that you can do to help prevent falls. This information is not intended to replace advice given to you by your health care provider. Make sure you discuss any questions you have with your health care provider. Document Released: 06/02/2009 Document Revised: 01/12/2016 Document Reviewed: 09/10/2014 Elsevier Interactive Patient Education  2017 [employer].

## 2023-06-26 NOTE — Progress Notes (Signed)
Subjective:   Bryan Zimmerman is a 77 y.o. male who presents for Medicare Annual/Subsequent preventive examination.  Visit Complete: Virtual I connected with  [name] on 06/26/23 by a audio enabled telemedicine application and verified that I am speaking with the correct person using two identifiers.  Patient Location: Home  Provider Location: Home Office  I discussed the limitations of evaluation and management by telemedicine. The patient expressed understanding and agreed to proceed.  Vital Signs: Because this visit was a virtual/telehealth visit, some criteria may be missing or patient reported. Any vitals not documented were not able to be obtained and vitals that have been documented are patient reported.  Cardiac Risk Factors include: advanced age (>47men, >37 women);hypertension;male gender     Objective:    There were no vitals filed for this visit. There is no height or weight on file to calculate BMI.     06/26/2023    2:54 PM 05/25/2021    3:41 PM 12/02/2019   11:34 AM  Advanced Directives  Does Patient Have a Medical Advance Directive? Yes Yes Yes  Type of Estate agent of [employer] Power of [place];Living will Healthcare Power of [place];Living will  Does patient want to make changes to medical advance directive?  No - Patient declined   Copy of Healthcare Power of Attorney in Chart? No - copy requested No - copy requested     Current Medications (verified) Outpatient Encounter Medications as of 06/26/2023  Medication Sig   amLODipine (NORVASC) 10 MG tablet TAKE ONE TABLET BY MOUTH ONCE DAILY   aspirin EC 81 MG tablet Take 81 mg by mouth daily. Swallow whole.   carvedilol (COREG) 6.25 MG tablet Take 1 tablet (6.25 mg total) by mouth 2 (two) times daily with a meal.   cetirizine (ZYRTEC) 10 MG tablet Take 10 mg by mouth daily.   co-enzyme Q-10 30 MG capsule Take 100 mg by mouth daily.   fenofibrate 160 MG tablet TAKE ONE  TABLET BY MOUTH ONCE DAILY   lisinopril (ZESTRIL) 40 MG tablet TAKE ONE TABLET BY MOUTH TWICE DAILY   Misc Natural Products (PROSTATE SUPPORT) 300-15 MG TABS Take 1 tablet by mouth daily.   nitroGLYCERIN (NITROSTAT) 0.4 MG SL tablet Place 1 tablet (0.4 mg total) under the tongue every 5 (five) minutes as needed for chest pain.   rosuvastatin (CRESTOR) 20 MG tablet TAKE ONE TABLET BY MOUTH ONCE DAILY   Boswellia-Glucosamine-Vit D (OSTEO BI-FLEX ONE PER DAY PO) Take 1 tablet by mouth daily. (Patient not taking: Reported on 05/02/2023)   No facility-administered encounter medications on file as of 06/26/2023.    Allergies (verified) Patient has no known allergies.   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     Family History  Problem Relation Age of Onset   Dementia Mother    Cancer Father        pancreatic   Hypertension Other    Hyperlipidemia Other    Social History   Socioeconomic History   Marital status: Married  Spouse name: Bryan Zimmerman   Number of children: Not on file   Years of education: Not on file   Highest education level: Not on file  Occupational History   Not on file  Tobacco Use   Smoking status: Former    Types: Cigarettes   Smokeless tobacco: Never  Vaping Use   Vaping status: Never Used  Substance and Sexual Activity   Alcohol use: Never   Drug use: Never   Sexual activity: Not Currently  Other Topics Concern   Not on file  Social History Narrative   Not on file   Social Determinants of Health   Financial Resource Strain: Low Risk  (04/26/2022)   Overall Financial Resource Strain (CARDIA)     Difficulty of Paying Living Expenses: Not hard at all  Food Insecurity: No Food Insecurity (06/26/2023)   Hunger Vital Sign    Worried About Running Out of Food in the Last Year: Never true    Ran Out of Food in the Last Year: Never true  Transportation Needs: No Transportation Needs (06/26/2023)   PRAPARE - Administrator, Civil Service (Medical): No    Lack of Transportation (Non-Medical): No  Physical Activity: Inactive (05/22/2022)   Exercise Vital Sign    Days of Exercise per Week: 0 days    Minutes of Exercise per Session: 0 min  Stress: No Stress Concern Present (05/22/2022)   [employer] of Occupational Health - Occupational Stress Questionnaire    Feeling of Stress : Not at all  Social Connections: Moderately Integrated (06/26/2023)   Social Connection and Isolation Panel [NHANES]    Frequency of Communication with Friends and Family: More than three times a week    Frequency of Social Gatherings with Friends and Family: Three times a week    Attends Religious Services: More than 4 times per year    Active Member of Clubs or Organizations: No    Attends Banker Meetings: Never    Marital Status: Married    Tobacco Counseling Counseling given: Not Answered   Clinical Intake:  Pre-visit preparation completed: Yes  Pain : No/denies pain     Diabetes: No  How often do you need to have someone help you when you read instructions, pamphlets, or other written materials from your doctor or pharmacy?: 1 - Never  Interpreter Needed?: No  Information entered by :: Remi Haggard LPN   Activities of Daily Living    06/26/2023    2:57 PM  In your present state of health, do you have any difficulty performing the following activities:  Hearing? 1  Vision? 0  Difficulty concentrating or making decisions? 0  Walking or climbing stairs? 0  Dressing or bathing? 0  Doing errands, shopping? 0  Preparing Food and eating ? N  Using the Toilet? N  In  the past six months, have you accidently leaked urine? N  Do you have problems with loss of bowel control? N  Managing your Medications? N  Managing your Finances? N  Housekeeping or managing your Housekeeping? N    Patient Care Team: Blane Ohara, MD as PCP - General (Family Medicine) Georgeanna Lea, MD as Consulting Physician (Cardiology) Zettie Pho, [facility] (Inactive) (Pharmacist)  Indicate any recent Medical Services you may have received from other than [facility] providers in the past year (date may be approximate).     Assessment:   This is a routine wellness examination for Bryan Zimmerman.  Hearing/Vision screen Hearing Screening - Comments::  Bilateral hearing aids Vision Screening - Comments:: Not up to date Cataract surgery   Goals Addressed             This Visit's Progress    Patient Stated       Continue current lifestyle       Depression Screen    06/26/2023    3:03 PM 05/02/2023    2:53 PM 12/31/2022    2:20 PM 06/22/2022    9:36 AM 05/22/2022    2:11 PM 07/12/2021    8:25 AM 05/25/2021    3:42 PM  PHQ 2/9 Scores  PHQ - 2 Score 0 0 0 0 0 0 0  PHQ- 9 Score 0 0 0 0 0      Fall Risk    06/26/2023    2:56 PM 05/02/2023    2:53 PM 12/31/2022    2:20 PM 06/22/2022    9:35 AM 05/22/2022    2:11 PM  Fall Risk   Falls in the past year? 1 0 0 0 0  Number falls in past yr: 0 0 0 0 0  Injury with Fall? 0 0 0 0 0  Risk for fall due to :  No Fall Risks No Fall Risks No Fall Risks No Fall Risks  Follow up Falls evaluation completed;Education provided;Falls prevention discussed Falls evaluation completed;Falls prevention discussed Falls evaluation completed;Falls prevention discussed Falls evaluation completed;Education provided Falls evaluation completed    MEDICARE RISK AT HOME: Medicare Risk at Home Any stairs in or around the home?: No If so, are there any without handrails?: No Home free of loose throw rugs in walkways, pet beds, electrical cords, etc?:  Yes Adequate lighting in your home to reduce risk of falls?: Yes Life alert?: No Use of a cane, walker or w/c?: No Grab bars in the bathroom?: Yes Shower chair or bench in shower?: Yes Elevated toilet seat or a handicapped toilet?: No  TIMED UP AND GO:  Was the test performed?  No    Cognitive Function:        06/26/2023    2:54 PM 06/22/2022    9:37 AM 05/26/2021    8:20 AM  6CIT Screen  What Year? 0 points 0 points 0 points  What month? 0 points 0 points 0 points  What time? 0 points 0 points 0 points  Count back from 20 0 points 0 points 0 points  Months in reverse 0 points 0 points 0 points  Repeat phrase 0 points 0 points 0 points  Total Score 0 points 0 points 0 points    Immunizations Immunization History  Administered Date(s) Administered   Fluad Quad(high Dose 65+) 04/21/2020, 05/25/2021, 05/22/2022   Fluad Trivalent(High Dose 65+) 05/02/2023   Moderna Covid-19 Vaccine Bivalent Booster 45yrs & up 07/12/2021   Moderna SARS-COV2 Booster Vaccination 01/09/2021   Moderna Sars-Covid-2 Vaccination 10/02/2019, 10/30/2019, 07/01/2020   Pfizer(Comirnaty)Fall Seasonal Vaccine 12 years and older 05/02/2023   Pneumococcal Conjugate-13 06/25/2014   Pneumococcal Polysaccharide-23 06/09/2013   Tdap 08/22/2010, 04/21/2020   Zoster Recombinant(Shingrix) 04/05/2023   Zoster, Live 08/20/2012    TDAP status: Up to date  Flu Vaccine status: Up to date  Pneumococcal vaccine status: Up to date  Covid-19 vaccine status: Information provided on how to obtain vaccines.   Qualifies for Shingles Vaccine? Yes   Zostavax completed No   Shingrix Completed?: No.    Education has been provided regarding the importance of this vaccine. Patient has been advised to call insurance company to  determine out of pocket expense if they have not yet received this vaccine. Advised may also receive vaccine at local pharmacy or Health Dept. Verbalized acceptance and understanding.  Screening  Tests Health Maintenance  Topic Date Due   COVID-19 Vaccine (6 - 2023-24 season) 06/27/2023   Zoster Vaccines- Shingrix (2 of 2) 09/26/2023 (Originally 05/31/2023)   Medicare Annual Wellness (AWV)  06/25/2024   DTaP/Tdap/Td (3 - Td or Tdap) 04/21/2030   Pneumonia Vaccine 71+ Years old  Completed   INFLUENZA VACCINE  Completed   Hepatitis C Screening  Completed   HPV VACCINES  Aged Out   Colonoscopy  Discontinued    Health Maintenance  Health Maintenance Due  Topic Date Due   COVID-19 Vaccine (6 - 2023-24 season) 06/27/2023    Colorectal cancer screening: No longer required.   Lung Cancer Screening: (Low Dose CT Chest recommended if Age 44-80 years, 20 pack-year currently smoking OR have quit w/in 15years.) does not qualify.   Lung Cancer Screening Referral:   Additional Screening:  Hepatitis C Screening: does not qualify; Completed 2023  Vision Screening: Recommended annual ophthalmology exams for early detection of glaucoma and other disorders of the eye. Is the patient up to date with their annual eye exam?  No  Who is the provider or what is the name of the office in which the patient attends annual eye exams? Not up to date If pt is not established with a provider, would they like to be referred to a provider to establish care? No .   Dental Screening: Recommended annual dental exams for proper oral hygiene   Community Resource Referral / Chronic Care Management: CRR required this visit?  No   CCM required this visit?  No     Plan:     I have personally reviewed and noted the following in the patient's chart:   Medical and social history Use of alcohol, tobacco or illicit drugs  Current medications and supplements including opioid prescriptions. Patient is not currently taking opioid prescriptions. Functional ability and status Nutritional status Physical activity Advanced directives List of other physicians Hospitalizations, surgeries, and ER visits in  previous 12 months Vitals Screenings to include cognitive, depression, and falls Referrals and appointments  In addition, I have reviewed and discussed with patient certain preventive protocols, quality metrics, and best practice recommendations. A written personalized care plan for preventive services as well as general preventive health recommendations were provided to patient.     Remi Haggard, LPN   16/08/958   After Visit Summary: (MyChart) Due to this being a telephonic visit, the after visit summary with patients personalized plan was offered to patient via MyChart   Nurse Notes:

## 2023-07-08 ENCOUNTER — Other Ambulatory Visit: Payer: Self-pay | Admitting: Family Medicine

## 2023-08-06 ENCOUNTER — Other Ambulatory Visit: Payer: Self-pay

## 2023-08-06 DIAGNOSIS — R7303 Prediabetes: Secondary | ICD-10-CM

## 2023-08-06 DIAGNOSIS — I119 Hypertensive heart disease without heart failure: Secondary | ICD-10-CM

## 2023-08-06 DIAGNOSIS — E782 Mixed hyperlipidemia: Secondary | ICD-10-CM

## 2023-08-08 ENCOUNTER — Ambulatory Visit: Payer: PPO

## 2023-08-08 DIAGNOSIS — R7303 Prediabetes: Secondary | ICD-10-CM | POA: Diagnosis not present

## 2023-08-08 DIAGNOSIS — I119 Hypertensive heart disease without heart failure: Secondary | ICD-10-CM | POA: Diagnosis not present

## 2023-08-08 DIAGNOSIS — E782 Mixed hyperlipidemia: Secondary | ICD-10-CM

## 2023-08-09 LAB — LIPID PANEL
Chol/HDL Ratio: 4.4 {ratio} (ref 0.0–5.0)
Cholesterol, Total: 135 mg/dL (ref 100–199)
HDL: 31 mg/dL — ABNORMAL LOW (ref >39)
LDL Chol Calc (NIH): 82 mg/dL (ref 0–99)
Triglycerides: 120 mg/dL (ref 0–149)
VLDL Cholesterol Cal: 22 mg/dL (ref 5–40)

## 2023-08-09 LAB — CBC WITH DIFFERENTIAL/PLATELET
Basophils Absolute: 0.1 10*3/uL (ref 0.0–0.2)
Basos: 1 %
EOS (ABSOLUTE): 0.4 10*3/uL (ref 0.0–0.4)
Eos: 5 %
Hematocrit: 42.7 % (ref 37.5–51.0)
Hemoglobin: 13.9 g/dL (ref 13.0–17.7)
Immature Grans (Abs): 0.1 10*3/uL (ref 0.0–0.1)
Immature Granulocytes: 1 %
Lymphocytes Absolute: 1.4 10*3/uL (ref 0.7–3.1)
Lymphs: 17 %
MCH: 29.3 pg (ref 26.6–33.0)
MCHC: 32.6 g/dL (ref 31.5–35.7)
MCV: 90 fL (ref 79–97)
Monocytes Absolute: 0.8 10*3/uL (ref 0.1–0.9)
Monocytes: 9 %
Neutrophils Absolute: 5.7 10*3/uL (ref 1.4–7.0)
Neutrophils: 67 %
Platelets: 255 10*3/uL (ref 150–450)
RBC: 4.75 x10E6/uL (ref 4.14–5.80)
RDW: 12.5 % (ref 11.6–15.4)
WBC: 8.4 10*3/uL (ref 3.4–10.8)

## 2023-08-09 LAB — COMPREHENSIVE METABOLIC PANEL
ALT: 22 [IU]/L (ref 0–44)
AST: 25 [IU]/L (ref 0–40)
Albumin: 4.4 g/dL (ref 3.8–4.8)
Alkaline Phosphatase: 51 [IU]/L (ref 44–121)
BUN/Creatinine Ratio: 21 (ref 10–24)
BUN: 22 mg/dL (ref 8–27)
Bilirubin Total: 0.3 mg/dL (ref 0.0–1.2)
CO2: 22 mmol/L (ref 20–29)
Calcium: 9.6 mg/dL (ref 8.6–10.2)
Chloride: 104 mmol/L (ref 96–106)
Creatinine, Ser: 1.05 mg/dL (ref 0.76–1.27)
Globulin, Total: 2.3 g/dL (ref 1.5–4.5)
Glucose: 86 mg/dL (ref 70–99)
Potassium: 4.7 mmol/L (ref 3.5–5.2)
Sodium: 140 mmol/L (ref 134–144)
Total Protein: 6.7 g/dL (ref 6.0–8.5)
eGFR: 73 mL/min/{1.73_m2} (ref >59)

## 2023-08-09 LAB — HEMOGLOBIN A1C
Est. average glucose Bld gHb Est-mCnc: 126 mg/dL
Hgb A1c MFr Bld: 6 % — ABNORMAL HIGH (ref 4.8–5.6)

## 2023-08-11 NOTE — Progress Notes (Unsigned)
Subjective:  Patient ID: Bryan Zimmerman, male    DOB: 1946-04-21  Age: 77 y.o. MRN: [id number]  Chief Complaint  Patient presents with   Medical Management of Chronic Issues    HPI Hypertensive heart disease: currently taking Amlodipine 10 mg daily, Aspirin 81 mg daily, Carvedilol 6.25 mg twice a day, Lisinopril 40 mg twice a day.   Hyperlipidemia: taking Rosuvastatin 20 mg daily, CoQ10 100 mg daily and Fenofibrate 160 mg daily.   Low fat diet and active in yard work.      06/26/2023    3:03 PM 05/02/2023    2:53 PM 12/31/2022    2:20 PM 06/22/2022    9:36 AM 05/22/2022    2:11 PM  Depression screen PHQ 2/9  Decreased Interest 0 0 0 0 0  Down, Depressed, Hopeless 0 0 0 0 0  PHQ - 2 Score 0 0 0 0 0  Altered sleeping 0 0 0 0 0  Tired, decreased energy 0 0 0 0 0  Change in appetite 0 0 0 0 0  Feeling bad or failure about yourself  0 0 0 0 0  Trouble concentrating 0 0 0 0 0  Moving slowly or fidgety/restless 0 0 0 0 0  Suicidal thoughts 0 0 0 0 0  PHQ-9 Score 0 0 0 0 0  Difficult doing work/chores Not difficult at all Not difficult at all Not difficult at all Not difficult at all Not difficult at all        06/26/2023    2:56 PM  Fall Risk   Falls in the past year? 1  Number falls in past yr: 0  Injury with Fall? 0  Follow up Falls evaluation completed;Education provided;Falls prevention discussed    Patient Care Team: Blane Ohara, MD as PCP - General (Family Medicine) Georgeanna Lea, MD as Consulting Physician (Cardiology) Zettie Pho, [facility] (Inactive) (Pharmacist)   Review of Systems  Constitutional:  Negative for appetite change, fatigue and fever.  HENT:  Negative for congestion, ear pain, sinus pressure and sore throat.   Respiratory:  Negative for cough, chest tightness, shortness of breath and wheezing.   Cardiovascular:  Negative for chest pain and palpitations.  Gastrointestinal:  Negative for abdominal pain, constipation, diarrhea, nausea and  vomiting.  Genitourinary:  Negative for dysuria and hematuria.  Musculoskeletal:  Negative for arthralgias, back pain, joint swelling and myalgias.  Skin:  Negative for rash.  Neurological:  Negative for dizziness, weakness and headaches.  Psychiatric/Behavioral:  Negative for dysphoric mood. The patient is not nervous/anxious.     Current Outpatient Medications on File Prior to Visit  Medication Sig Dispense Refill   amLODipine (NORVASC) 10 MG tablet TAKE ONE TABLET BY MOUTH ONCE DAILY 90 tablet 1   aspirin EC 81 MG tablet Take 81 mg by mouth daily. Swallow whole.     carvedilol (COREG) 6.25 MG tablet TAKE ONE TABLET BY MOUTH TWICE DAILY WITH A MEAL 64 tablet 0   cetirizine (ZYRTEC) 10 MG tablet Take 10 mg by mouth daily.     co-enzyme Q-10 30 MG capsule Take 100 mg by mouth daily.     fenofibrate 160 MG tablet TAKE ONE TABLET BY MOUTH ONCE DAILY 90 tablet 1   lisinopril (ZESTRIL) 40 MG tablet TAKE ONE TABLET BY MOUTH TWICE DAILY 180 tablet 1   Misc Natural Products (PROSTATE SUPPORT) 300-15 MG TABS Take 1 tablet by mouth daily.     nitroGLYCERIN (NITROSTAT) 0.4 MG SL tablet Place  1 tablet (0.4 mg total) under the tongue every 5 (five) minutes as needed for chest pain. 25 tablet 2   rosuvastatin (CRESTOR) 20 MG tablet TAKE ONE TABLET BY MOUTH ONCE DAILY 90 tablet 1   Boswellia-Glucosamine-Vit D (OSTEO BI-FLEX ONE PER DAY PO) Take 1 tablet by mouth daily. (Patient not taking: Reported on 08/12/2023)     No current facility-administered medications on file prior to visit.   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      Family History   Problem Relation Age of Onset   Dementia Mother    Cancer Father        pancreatic   Hypertension Other    Hyperlipidemia Other    Social History   Socioeconomic History   Marital status: Married    Spouse name: Harriett Sine   Number of children: Not on file   Years of education: Not on file   Highest education level: Not on file  Occupational History   Not on file  Tobacco Use   Smoking status: Former    Types: Cigarettes   Smokeless tobacco: Never  Vaping Use   Vaping status: Never Used  Substance and Sexual Activity   Alcohol use: Never   Drug use: Never   Sexual activity: Not Currently  Other Topics Concern   Not on file  Social History Narrative   Not on file   Social Drivers of Health   Financial Resource Strain: Low Risk  (04/26/2022)   Overall Financial Resource Strain (CARDIA)    Difficulty of Paying Living Expenses: Not hard at all  Food Insecurity: No Food Insecurity (06/26/2023)   Hunger Vital Sign    Worried About Running Out of Food in the Last Year: Never true    Ran Out of Food in the Last Year: Never true  Transportation Needs: No Transportation Needs (06/26/2023)   PRAPARE - Administrator, Civil Service (Medical): No    Lack of Transportation (Non-Medical): No  Physical Activity: Inactive (05/22/2022)   Exercise Vital Sign    Days of Exercise per Week: 0 days    Minutes of Exercise per Session: 0 min  Stress: No Stress Concern Present (05/22/2022)   [employer] of Occupational Health - Occupational Stress Questionnaire    Feeling of Stress : Not at all  Social Connections: Moderately Integrated (06/26/2023)   Social Connection and Isolation Panel [NHANES]    Frequency of Communication with Friends and Family: More than three times a week    Frequency of Social Gatherings with Friends and Family: Three times a week    Attends Religious Services: More than 4 times per year    Active Member of Clubs or Organizations: No    Attends Tax inspector Meetings: Never    Marital Status: Married    Objective:  BP (!) 178/68 (BP Location: Left Arm, Patient Position: Sitting)   Pulse 68   Temp (!) 97.4 F (36.3 C) (Temporal)   Ht 5\' 6"  (1.676 m)   Wt 183 lb (83 kg)  SpO2 98%   BMI 29.54 kg/m      08/12/2023    2:17 PM 05/02/2023    2:49 PM 12/31/2022    2:18 PM  BP/Weight  Systolic BP 178 120 136  Diastolic BP 68 50 60  Wt. (Lbs) 183 187.6 187  BMI 29.54 kg/m2 30.28 kg/m2 27.62 kg/m2    Physical Exam Vitals reviewed.  Constitutional:      Appearance: Normal appearance. He is obese.  Neck:     Vascular: No carotid bruit.  Cardiovascular:     Rate and Rhythm: Normal rate and regular rhythm.     Heart sounds: Normal heart sounds.  Pulmonary:     Effort: Pulmonary effort is normal.     Breath sounds: Normal breath sounds. No wheezing, rhonchi or rales.  Abdominal:     General: Bowel sounds are normal.     Palpations: Abdomen is soft.     Tenderness: There is no abdominal tenderness.  Neurological:     Mental Status: He is alert and oriented to person, place, and time.  Psychiatric:        Mood and Affect: Mood normal.        Behavior: Behavior normal.     Diabetic Foot Exam - Simple   No data filed      Lab Results  Component Value Date   WBC 8.4 08/08/2023   HGB 13.9 08/08/2023   HCT 42.7 08/08/2023   PLT 255 08/08/2023   GLUCOSE 86 08/08/2023   CHOL 135 08/08/2023   TRIG 120 08/08/2023   HDL 31 (L) 08/08/2023   LDLCALC 82 08/08/2023   ALT 22 08/08/2023   AST 25 08/08/2023   NA 140 08/08/2023   K 4.7 08/08/2023   CL 104 08/08/2023   CREATININE 1.05 08/08/2023   BUN 22 08/08/2023   CO2 22 08/08/2023   TSH 1.950 10/17/2021   INR 0.96 08/22/2010   HGBA1C 6.0 (H) 08/08/2023      Assessment & Plan:    Hypertensive heart disease without heart failure  Mixed hyperlipidemia  Prediabetes     No orders of the defined types were placed in this encounter.   No orders of the  defined types were placed in this encounter.    Follow-up: No follow-ups on file.   I,Marla I Leal-Borjas,acting as a scribe for Blane Ohara, MD.,have documented all relevant documentation on the behalf of Blane Ohara, MD,as directed by  Blane Ohara, MD while in the presence of Blane Ohara, MD.   An After Visit Summary was printed and given to the patient.  Blane Ohara, MD [facility] [phone]

## 2023-08-12 ENCOUNTER — Ambulatory Visit (INDEPENDENT_AMBULATORY_CARE_PROVIDER_SITE_OTHER): Payer: PPO | Admitting: Family Medicine

## 2023-08-12 ENCOUNTER — Encounter: Payer: Self-pay | Admitting: Family Medicine

## 2023-08-12 VITALS — BP 140/62 | HR 68 | Temp 97.4°F | Ht 66.0 in | Wt 183.0 lb

## 2023-08-12 DIAGNOSIS — R7303 Prediabetes: Secondary | ICD-10-CM

## 2023-08-12 DIAGNOSIS — I119 Hypertensive heart disease without heart failure: Secondary | ICD-10-CM

## 2023-08-12 DIAGNOSIS — E782 Mixed hyperlipidemia: Secondary | ICD-10-CM | POA: Diagnosis not present

## 2023-08-13 NOTE — Assessment & Plan Note (Signed)
Hemoglobin A1c 6.0%, 3 month avg of blood sugars, is in prediabetic range.  In order to prevent progression to diabetes, recommend low carb diet and regular exercise 

## 2023-08-13 NOTE — Assessment & Plan Note (Signed)
Well controlled.  No changes to medicines. Continue Amlodipine 10 mg daily, Aspirin 81 mg daily, Carvedilol 6.25 mg twice a day, Lisinopril 40 mg twice a day. Continue to work on eating a healthy diet and exercise.  Labs reviewed

## 2023-08-13 NOTE — Assessment & Plan Note (Signed)
Well controlled.  No changes to medicines. Rosuvastatin 20 mg daily, CoQ10 100 mg daily and Fenofibrate 160 mg daily. Continue to work on eating a healthy diet and exercise.  Labs reviewed.

## 2023-08-20 ENCOUNTER — Other Ambulatory Visit: Payer: Self-pay

## 2023-08-20 MED ORDER — CARVEDILOL 6.25 MG PO TABS: 6.2500 mg | ORAL_TABLET | Freq: Two times a day (BID) | ORAL | 1 refills | Status: DC

## 2023-09-27 ENCOUNTER — Other Ambulatory Visit: Payer: Self-pay | Admitting: Family Medicine

## 2023-10-11 ENCOUNTER — Other Ambulatory Visit: Payer: Self-pay | Admitting: Family Medicine

## 2023-12-11 ENCOUNTER — Other Ambulatory Visit: Payer: Self-pay | Admitting: Family Medicine

## 2023-12-17 ENCOUNTER — Other Ambulatory Visit: Payer: Self-pay | Admitting: Family Medicine

## 2024-02-03 ENCOUNTER — Other Ambulatory Visit: Payer: Self-pay | Admitting: Family Medicine

## 2024-02-11 ENCOUNTER — Other Ambulatory Visit: Payer: Self-pay

## 2024-02-11 DIAGNOSIS — R7303 Prediabetes: Secondary | ICD-10-CM

## 2024-02-11 DIAGNOSIS — E782 Mixed hyperlipidemia: Secondary | ICD-10-CM

## 2024-02-11 DIAGNOSIS — I119 Hypertensive heart disease without heart failure: Secondary | ICD-10-CM

## 2024-02-13 ENCOUNTER — Other Ambulatory Visit: Payer: PPO

## 2024-02-13 DIAGNOSIS — R7303 Prediabetes: Secondary | ICD-10-CM | POA: Diagnosis not present

## 2024-02-13 DIAGNOSIS — E782 Mixed hyperlipidemia: Secondary | ICD-10-CM

## 2024-02-13 DIAGNOSIS — I119 Hypertensive heart disease without heart failure: Secondary | ICD-10-CM | POA: Diagnosis not present

## 2024-02-13 LAB — LIPID PANEL
Chol/HDL Ratio: 4.4 ratio (ref 0.0–5.0)
Cholesterol, Total: 131 mg/dL (ref 100–199)
HDL: 30 mg/dL — ABNORMAL LOW (ref >39)
LDL Chol Calc (NIH): 78 mg/dL (ref 0–99)
Triglycerides: 124 mg/dL (ref 0–149)
VLDL Cholesterol Cal: 23 mg/dL (ref 5–40)

## 2024-02-13 LAB — HEMOGLOBIN A1C
Est. average glucose Bld gHb Est-mCnc: 114 mg/dL
Hgb A1c MFr Bld: 5.6 % (ref 4.8–5.6)

## 2024-02-13 LAB — COMPREHENSIVE METABOLIC PANEL WITH GFR
ALT: 24 IU/L (ref 0–44)
AST: 28 IU/L (ref 0–40)
Albumin: 4.5 g/dL (ref 3.8–4.8)
Alkaline Phosphatase: 49 IU/L (ref 44–121)
BUN/Creatinine Ratio: 23 (ref 10–24)
BUN: 26 mg/dL (ref 8–27)
Bilirubin Total: 0.3 mg/dL (ref 0.0–1.2)
CO2: 19 mmol/L — ABNORMAL LOW (ref 20–29)
Calcium: 9.6 mg/dL (ref 8.6–10.2)
Chloride: 106 mmol/L (ref 96–106)
Creatinine, Ser: 1.11 mg/dL (ref 0.76–1.27)
Globulin, Total: 2.2 g/dL (ref 1.5–4.5)
Glucose: 95 mg/dL (ref 70–99)
Potassium: 4.9 mmol/L (ref 3.5–5.2)
Sodium: 141 mmol/L (ref 134–144)
Total Protein: 6.7 g/dL (ref 6.0–8.5)
eGFR: 68 mL/min/{1.73_m2} (ref >59)

## 2024-02-13 LAB — CBC WITH DIFFERENTIAL/PLATELET
Basophils Absolute: 0 10*3/uL (ref 0.0–0.2)
Basos: 1 %
EOS (ABSOLUTE): 0.4 10*3/uL (ref 0.0–0.4)
Eos: 6 %
Hematocrit: 42 % (ref 37.5–51.0)
Hemoglobin: 13.3 g/dL (ref 13.0–17.7)
Immature Grans (Abs): 0 10*3/uL (ref 0.0–0.1)
Immature Granulocytes: 0 %
Lymphocytes Absolute: 1 10*3/uL (ref 0.7–3.1)
Lymphs: 17 %
MCH: 29.7 pg (ref 26.6–33.0)
MCHC: 31.7 g/dL (ref 31.5–35.7)
MCV: 94 fL (ref 79–97)
Monocytes Absolute: 0.7 10*3/uL (ref 0.1–0.9)
Monocytes: 12 %
Neutrophils Absolute: 3.8 10*3/uL (ref 1.4–7.0)
Neutrophils: 64 %
Platelets: 211 10*3/uL (ref 150–450)
RBC: 4.48 x10E6/uL (ref 4.14–5.80)
RDW: 12.7 % (ref 11.6–15.4)
WBC: 6 10*3/uL (ref 3.4–10.8)

## 2024-02-16 ENCOUNTER — Ambulatory Visit: Payer: Self-pay | Admitting: Family Medicine

## 2024-02-17 ENCOUNTER — Ambulatory Visit (INDEPENDENT_AMBULATORY_CARE_PROVIDER_SITE_OTHER): Payer: PPO | Admitting: Family Medicine

## 2024-02-17 ENCOUNTER — Encounter: Payer: Self-pay | Admitting: Family Medicine

## 2024-02-17 VITALS — BP 138/68 | HR 85 | Temp 98.0°F | Ht 66.0 in | Wt 183.0 lb

## 2024-02-17 DIAGNOSIS — R7303 Prediabetes: Secondary | ICD-10-CM | POA: Diagnosis not present

## 2024-02-17 DIAGNOSIS — Z6829 Body mass index (BMI) 29.0-29.9, adult: Secondary | ICD-10-CM | POA: Diagnosis not present

## 2024-02-17 DIAGNOSIS — I2583 Coronary atherosclerosis due to lipid rich plaque: Secondary | ICD-10-CM | POA: Diagnosis not present

## 2024-02-17 DIAGNOSIS — I2510 Atherosclerotic heart disease of native coronary artery without angina pectoris: Secondary | ICD-10-CM

## 2024-02-17 DIAGNOSIS — E663 Overweight: Secondary | ICD-10-CM

## 2024-02-17 DIAGNOSIS — E782 Mixed hyperlipidemia: Secondary | ICD-10-CM

## 2024-02-17 DIAGNOSIS — M170 Bilateral primary osteoarthritis of knee: Secondary | ICD-10-CM

## 2024-02-17 DIAGNOSIS — I119 Hypertensive heart disease without heart failure: Secondary | ICD-10-CM

## 2024-02-17 NOTE — Progress Notes (Addendum)
 Subjective:  Patient ID: Bryan Zimmerman, male    DOB: February 11, 1946  Age: 78 y.o. MRN: [id number]  Chief Complaint  Patient presents with   Medical Management of Chronic Issues   Discussed the use of AI scribe software for clinical note transcription with the patient, who gave verbal consent to proceed.  History of Present Illness   Bryan Zimmerman is a 78 year old male with hypertension and coronary artery disease who presents for a follow-up visit.  Hypertension and coronary artery disease - History of coronary artery disease with prior myocardial infarction and placement of two stents - Currently taking amlodipine  10 mg daily, carvedilol  6.25 mg twice daily, lisinopril  40 mg twice daily, and aspirin 81 mg daily for blood pressure and cardiac protection - No chest pain, headaches, or palpitations - No leg pain other than knee pain  Dyslipidemia - Currently taking rosuvastatin  20 mg daily and fenofibrate  160 mg daily for cholesterol management - LDL cholesterol is slightly elevated - History of low HDL cholesterol, which has remained stable - Dietary modifications include reducing intake of fried foods and bread  Musculoskeletal pain - Chronic joint pain in knees and shoulders - Uses Advil as needed, primarily before physical activities such as mowing or weed eating - Finds Advil effective for symptom relief  Dizziness and dehydration - Recent episode of dizziness described as lightheadedness, occurred once while sitting - Attributes dizziness to possible dehydration, especially in hot weather - Does not drink adequate water and does not sweat much  Nephrolithiasis - History of kidney stones  Functional status and lifestyle - Remains physically active with regular yard work - Making efforts to eat healthier by reducing fried foods and bread      States he has had some dizziness but is unsure if it was heat related. Some dizziness with standing or position change while in  the bed. Admits he likely does not drink enough fluids through out the day.     02/17/2024    1:31 PM 06/26/2023    3:03 PM 05/02/2023    2:53 PM 12/31/2022    2:20 PM 06/22/2022    9:36 AM  Depression screen PHQ 2/9  Decreased Interest 0 0 0 0 0  Down, Depressed, Hopeless 0 0 0 0 0  PHQ - 2 Score 0 0 0 0 0  Altered sleeping 0 0 0 0 0  Tired, decreased energy 0 0 0 0 0  Change in appetite 0 0 0 0 0  Feeling bad or failure about yourself  0 0 0 0 0  Trouble concentrating 0 0 0 0 0  Moving slowly or fidgety/restless 0 0 0 0 0  Suicidal thoughts 0 0 0 0 0  PHQ-9 Score 0 0 0 0 0  Difficult doing work/chores Not difficult at all Not difficult at all Not difficult at all Not difficult at all Not difficult at all        02/17/2024    1:31 PM  Fall Risk   Falls in the past year? 0  Number falls in past yr: 0  Injury with Fall? 0  Risk for fall due to : No Fall Risks  Follow up Falls evaluation completed    Patient Care Team: Sherre Clapper, MD as PCP - General (Family Medicine) Bernie Lamar PARAS, MD as Consulting Physician (Cardiology) Nyle Rankin POUR, [facility] (Inactive) (Pharmacist)   Review of Systems  Constitutional:  Negative for chills, diaphoresis, fatigue and fever.  HENT:  Negative for congestion,  ear pain and sore throat.   Respiratory:  Negative for cough and shortness of breath.   Cardiovascular:  Negative for chest pain and leg swelling.  Gastrointestinal:  Negative for abdominal pain, constipation, diarrhea, nausea and vomiting.  Genitourinary:  Negative for dysuria and urgency.  Musculoskeletal:  Positive for arthralgias and back pain. Negative for myalgias.  Neurological:  Positive for dizziness. Negative for headaches.  Psychiatric/Behavioral:  Negative for dysphoric mood.     Current Outpatient Medications on File Prior to Visit  Medication Sig Dispense Refill   amLODipine  (NORVASC ) 10 MG tablet TAKE ONE TABLET BY MOUTH ONCE DAILY 90 tablet 0   aspirin EC 81 MG  tablet Take 81 mg by mouth daily. Swallow whole.     carvedilol  (COREG ) 6.25 MG tablet Take 1 tablet (6.25 mg total) by mouth 2 (two) times daily with a meal. 180 tablet 1   cetirizine (ZYRTEC) 10 MG tablet Take 10 mg by mouth daily.     co-enzyme Q-10 30 MG capsule Take 100 mg by mouth daily.     fenofibrate  160 MG tablet TAKE ONE TABLET BY MOUTH ONCE DAILY 90 tablet 0   lisinopril  (ZESTRIL ) 40 MG tablet TAKE ONE TABLET BY MOUTH TWICE DAILY 180 tablet 0   Misc Natural Products (PROSTATE SUPPORT) 300-15 MG TABS Take 1 tablet by mouth daily.     nitroGLYCERIN  (NITROSTAT ) 0.4 MG SL tablet Place 1 tablet (0.4 mg total) under the tongue every 5 (five) minutes as needed for chest pain. 25 tablet 2   rosuvastatin  (CRESTOR ) 20 MG tablet TAKE ONE TABLET BY MOUTH ONCE DAILY 90 tablet 0   No current facility-administered medications on file prior to visit.   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      Family History  Problem Relation Age of Onset   Dementia Mother    Cancer Father        pancreatic   Hypertension Other    Hyperlipidemia Other    Social History   Socioeconomic History   Marital status: Married    Spouse name: Inocente   Number of children: Not on file   Years of education: Not on file   Highest education level: Not on file  Occupational History   Not on file  Tobacco Use   Smoking status: Former    Types: Cigarettes   Smokeless tobacco: Never  Vaping Use   Vaping status: Never Used  Substance and Sexual Activity   Alcohol use: Never   Drug use: Never   Sexual activity: Not Currently  Other Topics Concern   Not on file   Social History Narrative   Not on file   Social Drivers of Health   Financial Resource Strain: Low Risk  (04/26/2022)   Overall Financial Resource Strain (CARDIA)    Difficulty of Paying Living Expenses: Not hard at all  Food Insecurity: No Food Insecurity (06/26/2023)   Hunger Vital Sign    Worried About Running Out of Food in the Last Year: Never true    Ran Out of  Food in the Last Year: Never true  Transportation Needs: No Transportation Needs (06/26/2023)   PRAPARE - Administrator, Civil Service (Medical): No    Lack of Transportation (Non-Medical): No  Physical Activity: Inactive (05/22/2022)   Exercise Vital Sign    Days of Exercise per Week: 0 days    Minutes of Exercise per Session: 0 min  Stress: No Stress Concern Present (05/22/2022)   [employer] of Occupational Health - Occupational Stress Questionnaire    Feeling of Stress : Not at all  Social Connections: Moderately Integrated (06/26/2023)   Social Connection and Isolation Panel    Frequency of Communication with Friends and Family: More than three times a week    Frequency of Social Gatherings with Friends and Family: Three times a week    Attends Religious Services: More than 4 times per year    Active Member of Clubs or Organizations: No    Attends Banker Meetings: Never    Marital Status: Married    Objective:  BP 138/68   Pulse 85   Temp 98 F (36.7 C)   Ht 5' 6 (1.676 m)   Wt 183 lb (83 kg)   SpO2 95%   BMI 29.54 kg/m      02/17/2024    2:06 PM 02/17/2024    1:39 PM 02/17/2024    1:38 PM  BP/Weight  Systolic BP 138 150 122  Diastolic BP 68 58 58    Physical Exam Vitals reviewed.  Constitutional:      Appearance: Normal appearance.  Neck:     Vascular: No carotid bruit.   Cardiovascular:     Rate and Rhythm: Normal rate and regular rhythm.     Heart sounds: Normal heart sounds.  Pulmonary:     Effort: Pulmonary effort is normal.     Breath sounds: Normal  breath sounds. No wheezing, rhonchi or rales.  Abdominal:     General: Bowel sounds are normal.     Palpations: Abdomen is soft.     Tenderness: There is no abdominal tenderness.   Neurological:     Mental Status: He is alert and oriented to person, place, and time.   Psychiatric:        Mood and Affect: Mood normal.        Behavior: Behavior normal.         Lab Results  Component Value Date   WBC 6.0 02/13/2024   HGB 13.3 02/13/2024   HCT 42.0 02/13/2024   PLT 211 02/13/2024   GLUCOSE 95 02/13/2024   CHOL 131 02/13/2024   TRIG 124 02/13/2024   HDL 30 (L) 02/13/2024   LDLCALC 78 02/13/2024   ALT 24 02/13/2024   AST 28 02/13/2024   NA 141 02/13/2024   K 4.9 02/13/2024   CL 106 02/13/2024   CREATININE 1.11 02/13/2024   BUN 26 02/13/2024   CO2 19 (L) 02/13/2024   TSH 1.950 10/17/2021   INR 0.96 08/22/2010   HGBA1C 5.6 02/13/2024      Assessment & Plan:  Mixed hyperlipidemia Assessment & Plan: LDL slightly elevated at 78 mg/dL, target less than 55 mg/dL due to heart disease. Consider increasing rosuvastatin  if tolerated. - Consider increasing rosuvastatin  to 40 mg if tolerated. - Encourage dietary modifications to reduce fried foods and increase healthy fats like fish.   Hypertensive heart disease without heart failure Assessment & Plan: Blood pressure improved with current regimen. Continued monitoring necessary. - Recheck blood pressure during  the visit. - Continue current antihypertensive medications: amlodipine , carvedilol , and lisinopril .   Coronary artery disease due to lipid rich plaque Assessment & Plan: History of myocardial infarction and stent placement. Monitoring cholesterol and blood pressure is crucial. - Continue current medications, including aspirin and statins. - Monitor cholesterol levels and adjust treatment as needed.   Primary osteoarthritis of both knees Assessment & Plan: Joint pain in knees and shoulders managed with Advil as  needed. - Continue using Advil as needed for joint pain.   Prediabetes Assessment & Plan: Recommend continue to work on eating healthy diet and exercise.    Overweight with body mass index (BMI) of 29 to 29.9 in adult Assessment & Plan: Recommend continue to work on eating healthy diet and exercise.        LABS REVIEWED FULLY WITH PATIENT.    No orders of the defined types were placed in this encounter.   No orders of the defined types were placed in this encounter.    Follow-up: Return in about 4 months (around 06/18/2024) for chronic follow up.   I,Katherina A Bramblett,acting as a scribe for Abigail Free, MD.,have documented all relevant documentation on the behalf of Abigail Free, MD,as directed by  Abigail Free, MD while in the presence of Abigail Free, MD.   An After Visit Summary was printed and given to the patient.  I attest that I have reviewed this visit and agree with the plan scribed by my staff.  Abigail Free, MD [facility] [phone]

## 2024-02-17 NOTE — Assessment & Plan Note (Signed)
 Recommend continue to work on eating healthy diet and exercise.

## 2024-02-17 NOTE — Assessment & Plan Note (Signed)
 LDL slightly elevated at 78 mg/dL, target less than 55 mg/dL due to heart disease. Consider increasing rosuvastatin  if tolerated. - Consider increasing rosuvastatin  to 40 mg if tolerated. - Encourage dietary modifications to reduce fried foods and increase healthy fats like fish.

## 2024-02-17 NOTE — Addendum Note (Signed)
 Addended byBETHA SHERRE CLAPPER on: 02/17/2024 08:53 PM   Modules accepted: Level of Service

## 2024-02-17 NOTE — Assessment & Plan Note (Signed)
 History of myocardial infarction and stent placement. Monitoring cholesterol and blood pressure is crucial. - Continue current medications, including aspirin and statins. - Monitor cholesterol levels and adjust treatment as needed.

## 2024-02-17 NOTE — Assessment & Plan Note (Signed)
 Joint pain in knees and shoulders managed with Advil as needed. - Continue using Advil as needed for joint pain.

## 2024-02-17 NOTE — Assessment & Plan Note (Signed)
 Blood pressure improved with current regimen. Continued monitoring necessary. - Recheck blood pressure during the visit. - Continue current antihypertensive medications: amlodipine , carvedilol , and lisinopril .

## 2024-02-18 ENCOUNTER — Ambulatory Visit (INDEPENDENT_AMBULATORY_CARE_PROVIDER_SITE_OTHER)

## 2024-02-18 VITALS — BP 120/58 | HR 68 | Temp 97.5°F | Resp 16 | Ht 66.0 in | Wt 182.0 lb

## 2024-02-18 DIAGNOSIS — B079 Viral wart, unspecified: Secondary | ICD-10-CM

## 2024-02-18 DIAGNOSIS — B078 Other viral warts: Secondary | ICD-10-CM | POA: Diagnosis not present

## 2024-02-18 NOTE — Progress Notes (Unsigned)
 Subjective:  Patient ID: Bryan Zimmerman, male    DOB: 04-08-1946  Age: 78 y.o. MRN: [id number]  No chief complaint on file.   HPI:     02/17/2024    1:31 PM 06/26/2023    3:03 PM 05/02/2023    2:53 PM 12/31/2022    2:20 PM 06/22/2022    9:36 AM  Depression screen PHQ 2/9  Decreased Interest 0 0 0 0 0  Down, Depressed, Hopeless 0 0 0 0 0  PHQ - 2 Score 0 0 0 0 0  Altered sleeping 0 0 0 0 0  Tired, decreased energy 0 0 0 0 0  Change in appetite 0 0 0 0 0  Feeling bad or failure about yourself  0 0 0 0 0  Trouble concentrating 0 0 0 0 0  Moving slowly or fidgety/restless 0 0 0 0 0  Suicidal thoughts 0 0 0 0 0  PHQ-9 Score 0 0 0 0 0  Difficult doing work/chores Not difficult at all Not difficult at all Not difficult at all Not difficult at all Not difficult at all        02/17/2024    1:31 PM  Fall Risk   Falls in the past year? 0  Number falls in past yr: 0  Injury with Fall? 0  Risk for fall due to : No Fall Risks  Follow up Falls evaluation completed    Patient Care Team: Sherre Clapper, MD as PCP - General (Family Medicine) Bernie Lamar PARAS, MD as Consulting Physician (Cardiology) Nyle Rankin POUR, [facility] (Inactive) (Pharmacist)   Review of Systems  Constitutional:  Negative for chills, fatigue, fever and unexpected weight change.  HENT:  Negative for congestion, ear pain, sinus pain and sore throat.   Respiratory:  Negative for cough and shortness of breath.   Cardiovascular:  Negative for chest pain and palpitations.  Gastrointestinal:  Negative for abdominal pain, blood in stool, constipation, diarrhea, nausea and vomiting.  Endocrine: Negative for polydipsia.  Genitourinary:  Negative for dysuria.  Musculoskeletal:  Negative for back pain.  Skin:  Negative for rash.       Skin tag on left cheek on the face and left arm.  Neurological:  Negative for headaches.    Current Outpatient Medications on File Prior to Visit  Medication Sig Dispense Refill    amLODipine  (NORVASC ) 10 MG tablet TAKE ONE TABLET BY MOUTH ONCE DAILY 90 tablet 0   aspirin EC 81 MG tablet Take 81 mg by mouth daily. Swallow whole.     carvedilol  (COREG ) 6.25 MG tablet Take 1 tablet (6.25 mg total) by mouth 2 (two) times daily with a meal. 180 tablet 1   cetirizine (ZYRTEC) 10 MG tablet Take 10 mg by mouth daily.     co-enzyme Q-10 30 MG capsule Take 100 mg by mouth daily.     fenofibrate  160 MG tablet TAKE ONE TABLET BY MOUTH ONCE DAILY 90 tablet 0   lisinopril  (ZESTRIL ) 40 MG tablet TAKE ONE TABLET BY MOUTH TWICE DAILY 180 tablet 0   Misc Natural Products (PROSTATE SUPPORT) 300-15 MG TABS Take 1 tablet by mouth daily.     nitroGLYCERIN  (NITROSTAT ) 0.4 MG SL tablet Place 1 tablet (0.4 mg total) under the tongue every 5 (five) minutes as needed for chest pain. 25 tablet 2   rosuvastatin  (CRESTOR ) 20 MG tablet TAKE ONE TABLET BY MOUTH ONCE DAILY 90 tablet 0   No current facility-administered medications on file prior to visit.  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      Family History  Problem Relation Age of Onset   Dementia Mother    Cancer Father        pancreatic   Hypertension Other    Hyperlipidemia Other    Social History   Socioeconomic History   Marital status: Married    Spouse name: Inocente   Number of children: Not on file   Years of education: Not on file   Highest education level: Not on file  Occupational History   Not on file  Tobacco Use   Smoking status: Former    Types: Cigarettes   Smokeless tobacco: Never  Vaping Use   Vaping status: Never Used  Substance and Sexual Activity   Alcohol  use: Never   Drug use: Never   Sexual activity: Not Currently  Other Topics Concern   Not on file  Social History Narrative   Not on file   Social Drivers of Health   Financial Resource Strain: Low Risk  (04/26/2022)   Overall Financial Resource Strain (CARDIA)    Difficulty of Paying Living Expenses: Not hard at all  Food Insecurity: No Food Insecurity (06/26/2023)   Hunger Vital Sign    Worried About Running Out of Food in the Last Year: Never true    Ran Out of Food in the Last Year: Never true  Transportation Needs: No Transportation Needs (06/26/2023)   PRAPARE - Administrator, Civil Service (Medical): No    Lack of Transportation (Non-Medical): No  Physical Activity: Inactive (05/22/2022)   Exercise Vital Sign    Days of Exercise per Week: 0 days    Minutes of Exercise per Session: 0 min  Stress: No Stress Concern Present (05/22/2022)   [employer] of Occupational Health - Occupational Stress Questionnaire    Feeling of Stress : Not at all  Social Connections: Moderately Integrated (06/26/2023)   Social Connection and Isolation Panel    Frequency of Communication with Friends and Family: More than three times a week    Frequency of Social Gatherings with Friends and Family: Three times a week    Attends Religious Services: More than 4 times per year    Active Member of Clubs or Organizations: No    Attends Banker Meetings: Never    Marital Status: Married    Objective:  There were no vitals taken for this visit.     02/17/2024    2:06 PM 02/17/2024    1:39 PM 02/17/2024    1:38 PM  BP/Weight  Systolic BP 138 150 122  Diastolic BP 68 58 58    Physical Exam  {Perform Simple Foot Exam  Perform Detailed exam:1} {Insert foot Exam (Optional):30965}   Lab Results  Component Value Date   WBC 6.0 02/13/2024   HGB 13.3 02/13/2024   HCT 42.0 02/13/2024   PLT 211 02/13/2024   GLUCOSE 95 02/13/2024   CHOL 131 02/13/2024   TRIG 124  02/13/2024   HDL 30 (L) 02/13/2024  LDLCALC 78 02/13/2024   ALT 24 02/13/2024   AST 28 02/13/2024   NA 141 02/13/2024   K 4.9 02/13/2024   CL 106 02/13/2024   CREATININE 1.11 02/13/2024   BUN 26 02/13/2024   CO2 19 (L) 02/13/2024   TSH 1.950 10/17/2021   INR 0.96 08/22/2010   HGBA1C 5.6 02/13/2024      Assessment & Plan:  There are no diagnoses linked to this encounter.   No orders of the defined types were placed in this encounter.   No orders of the defined types were placed in this encounter.    Follow-up: No follow-ups on file.   I,Marla I Leal-Borjas,acting as a scribe for Polette Nofsinger, MD.,have documented all relevant documentation on the behalf of Akai Dollard, MD,as directed by  Roselene Gray, MD while in the presence of Tommy Schimke, MD.   An After Visit Summary was printed and given to the patient.  Aleksandar Duve, MD [facility] [phone]

## 2024-02-18 NOTE — Patient Instructions (Signed)
  VISIT SUMMARY: Today, we addressed the skin tags on your left cheek and left upper arm. Both lesions were excised, and you were given instructions on how to care for the excision sites.  YOUR PLAN: CUTANEOUS WARTS: You have two skin tags, one on your left cheek and another on your left upper arm. Both were removed today. -The skin tag on your left cheek was excised using forceps and 1% lidocaine. Silver nitrate was applied to stop any bleeding. -A bandage was applied to the cheek excision site. Keep it on for the day and remove it gently tomorrow. Apply pressure if bleeding occurs. -The skin tag on your left upper arm was also excised using the same technique. -A bandage was applied to the arm excision site. Remove it tomorrow. -Monitor both excision sites for increased redness or pain and report if these occur.                      Contains text generated by Abridge.                                 Contains text generated by Abridge.

## 2024-02-19 DIAGNOSIS — B079 Viral wart, unspecified: Secondary | ICD-10-CM | POA: Insufficient documentation

## 2024-02-19 NOTE — Assessment & Plan Note (Signed)
 Two cutaneous warts present: one on the left cheek (5-6 mm) and another on the left upper arm (3-4 mm). Both lesions appear benign. The cheek wart appeared suddenly; the arm wart has been present for a while. He is on baby aspirin, which may increase bleeding risk. Informed consent obtained for excision, discussing procedure, potential bleeding, and use of silver nitrate for hemostasis, which may temporarily stain the skin. Histopathological examination was offered but declined. - Excise wart on left cheek using forceps and 1% lidocaine, followed by lancing and silver nitrate application for hemostasis. - Apply bandage to cheek excision site; advise to keep it on for the day, remove gently the next day, and apply pressure if bleeding occurs. - Excise wart on left upper arm using same technique as cheek wart. - Apply bandage to arm excision site; advise to remove it the next day. - Instruct to monitor for increased redness or pain at excision sites and report if these occur.

## 2024-03-27 ENCOUNTER — Other Ambulatory Visit: Payer: Self-pay | Admitting: Family Medicine

## 2024-04-16 ENCOUNTER — Other Ambulatory Visit: Payer: Self-pay

## 2024-04-16 ENCOUNTER — Other Ambulatory Visit: Payer: Self-pay | Admitting: Family Medicine

## 2024-04-16 MED ORDER — AMLODIPINE BESYLATE 10 MG PO TABS: 10.0000 mg | ORAL_TABLET | Freq: Every day | ORAL | 0 refills | Status: DC

## 2024-04-16 MED ORDER — LISINOPRIL 40 MG PO TABS: 40.0000 mg | ORAL_TABLET | Freq: Two times a day (BID) | ORAL | 0 refills | Status: DC

## 2024-06-08 ENCOUNTER — Other Ambulatory Visit: Payer: Self-pay

## 2024-06-08 DIAGNOSIS — R7303 Prediabetes: Secondary | ICD-10-CM

## 2024-06-08 DIAGNOSIS — I119 Hypertensive heart disease without heart failure: Secondary | ICD-10-CM

## 2024-06-08 DIAGNOSIS — E782 Mixed hyperlipidemia: Secondary | ICD-10-CM

## 2024-06-12 ENCOUNTER — Other Ambulatory Visit

## 2024-06-12 DIAGNOSIS — I119 Hypertensive heart disease without heart failure: Secondary | ICD-10-CM

## 2024-06-12 DIAGNOSIS — E782 Mixed hyperlipidemia: Secondary | ICD-10-CM

## 2024-06-12 DIAGNOSIS — R7303 Prediabetes: Secondary | ICD-10-CM | POA: Diagnosis not present

## 2024-06-12 LAB — COMPREHENSIVE METABOLIC PANEL WITH GFR
ALT: 28 IU/L (ref 0–44)
AST: 30 IU/L (ref 0–40)
Albumin: 4.4 g/dL (ref 3.8–4.8)
Alkaline Phosphatase: 53 IU/L (ref 47–123)
BUN/Creatinine Ratio: 18 (ref 10–24)
BUN: 19 mg/dL (ref 8–27)
Bilirubin Total: 0.3 mg/dL (ref 0.0–1.2)
CO2: 23 mmol/L (ref 20–29)
Calcium: 9.8 mg/dL (ref 8.6–10.2)
Chloride: 104 mmol/L (ref 96–106)
Creatinine, Ser: 1.08 mg/dL (ref 0.76–1.27)
Globulin, Total: 2.5 g/dL (ref 1.5–4.5)
Glucose: 89 mg/dL (ref 70–99)
Potassium: 4.8 mmol/L (ref 3.5–5.2)
Sodium: 141 mmol/L (ref 134–144)
Total Protein: 6.9 g/dL (ref 6.0–8.5)
eGFR: 70 mL/min/1.73 (ref >59)

## 2024-06-12 LAB — CBC WITH DIFFERENTIAL/PLATELET
Basophils Absolute: 0.1 x10E3/uL (ref 0.0–0.2)
Basos: 1 %
EOS (ABSOLUTE): 0.8 x10E3/uL — ABNORMAL HIGH (ref 0.0–0.4)
Eos: 12 %
Hematocrit: 41.7 % (ref 37.5–51.0)
Hemoglobin: 13.4 g/dL (ref 13.0–17.7)
Immature Grans (Abs): 0 x10E3/uL (ref 0.0–0.1)
Immature Granulocytes: 0 %
Lymphocytes Absolute: 1.1 x10E3/uL (ref 0.7–3.1)
Lymphs: 16 %
MCH: 29.1 pg (ref 26.6–33.0)
MCHC: 32.1 g/dL (ref 31.5–35.7)
MCV: 91 fL (ref 79–97)
Monocytes Absolute: 0.6 x10E3/uL (ref 0.1–0.9)
Monocytes: 10 %
Neutrophils Absolute: 4 x10E3/uL (ref 1.4–7.0)
Neutrophils: 61 %
Platelets: 189 x10E3/uL (ref 150–450)
RBC: 4.6 x10E6/uL (ref 4.14–5.80)
RDW: 12.7 % (ref 11.6–15.4)
WBC: 6.6 x10E3/uL (ref 3.4–10.8)

## 2024-06-12 LAB — LIPID PANEL
Chol/HDL Ratio: 4.1 ratio (ref 0.0–5.0)
Cholesterol, Total: 122 mg/dL (ref 100–199)
HDL: 30 mg/dL — ABNORMAL LOW (ref >39)
LDL Chol Calc (NIH): 71 mg/dL (ref 0–99)
Triglycerides: 117 mg/dL (ref 0–149)
VLDL Cholesterol Cal: 21 mg/dL (ref 5–40)

## 2024-06-12 LAB — HEMOGLOBIN A1C
Est. average glucose Bld gHb Est-mCnc: 108 mg/dL
Hgb A1c MFr Bld: 5.4 % (ref 4.8–5.6)

## 2024-06-14 ENCOUNTER — Ambulatory Visit: Payer: Self-pay | Admitting: Family Medicine

## 2024-06-15 NOTE — Assessment & Plan Note (Signed)
 Bryan Zimmerman

## 2024-06-15 NOTE — Assessment & Plan Note (Signed)
 SABRA

## 2024-06-15 NOTE — Progress Notes (Signed)
 "  Subjective:  Patient ID: Bryan Zimmerman, male    DOB: [birth year]  Age: 78 y.o. MRN: [id number]  Chief Complaint  Patient presents with   Medical Management of Chronic Issues    HPI: Discussed the use of AI scribe software for clinical note transcription with the patient, who gave verbal consent to proceed.  History of Present Illness Bryan Zimmerman is a 78 year old male with degenerative spine disease and arthritis who presents with leg pain and difficulty with exertion.  Lower extremity pain and exertional symptoms - Leg pain and difficulty with exertion, especially during walking and yard work - Pain subsides with sitting and rest - No recent falls; significant fall occurred three to four years ago - Leg cramps present; has tried magnesium and mustard for relief  Lumbar spine disease and post-surgical status - History of low back surgery for ruptured disc - No current back pain radiating to the legs - Takes Advil regularly for back pain, typically a couple of tablets in the morning and at night  Knee arthritis and mechanical symptoms - Arthritis in both knees, with right knee more symptomatic - Right knee pain described as associated with a floating bone fragment - Pain improves with sitting - No history of knee surgery - Previous right knee injection provided minimal benefit - Takes Advil regularly for knee pain  Constitutional and cardiopulmonary symptoms - No chest pain - No breathing problems - No recent fevers, chills, or sweats - Recently recovered from a cold       02/17/2024    1:31 PM 06/26/2023    3:03 PM 05/02/2023    2:53 PM 12/31/2022    2:20 PM 06/22/2022    9:36 AM  Depression screen PHQ 2/9  Decreased Interest 0 0 0 0 0  Down, Depressed, Hopeless 0 0 0 0 0  PHQ - 2 Score 0 0 0 0 0  Altered sleeping 0 0 0 0 0  Tired, decreased energy 0 0 0 0 0  Change in appetite 0 0 0 0 0  Feeling bad or failure about yourself  0 0 0 0 0  Trouble concentrating  0 0 0 0 0  Moving slowly or fidgety/restless 0 0 0 0 0  Suicidal thoughts 0 0 0 0 0  PHQ-9 Score 0 0 0 0 0  Difficult doing work/chores Not difficult at all Not difficult at all Not difficult at all Not difficult at all Not difficult at all        02/17/2024    1:31 PM  Fall Risk   Falls in the past year? 0  Number falls in past yr: 0  Injury with Fall? 0  Risk for fall due to : No Fall Risks  Follow up Falls evaluation completed    Patient Care Team: Sherre Clapper, MD as PCP - General (Family Medicine) Bernie Lamar PARAS, MD as Consulting Physician (Cardiology) Nyle Rankin POUR, [facility] (Inactive) (Pharmacist)   Review of Systems  All other systems reviewed and are negative.   Current Outpatient Medications on File Prior to Visit  Medication Sig Dispense Refill   amLODipine  (NORVASC ) 10 MG tablet Take 1 tablet (10 mg total) by mouth daily. 90 tablet 0   aspirin EC 81 MG tablet Take 81 mg by mouth daily. Swallow whole.     carvedilol  (COREG ) 6.25 MG tablet Take 1 tablet (6.25 mg total) by mouth 2 (two) times daily with a meal. 180 tablet 1   cetirizine (ZYRTEC) 10  MG tablet Take 10 mg by mouth daily.     co-enzyme Q-10 30 MG capsule Take 100 mg by mouth daily.     fenofibrate  160 MG tablet TAKE ONE TABLET BY MOUTH ONCE DAILY 90 tablet 0   lisinopril  (ZESTRIL ) 40 MG tablet Take 1 tablet (40 mg total) by mouth 2 (two) times daily. 180 tablet 0   Misc Natural Products (PROSTATE SUPPORT) 300-15 MG TABS Take 1 tablet by mouth daily.     rosuvastatin  (CRESTOR ) 20 MG tablet TAKE ONE TABLET BY MOUTH ONCE DAILY 90 tablet 0   No current facility-administered medications on file prior to visit.   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      Family History  Problem Relation Age of Onset   Dementia Mother    Cancer Father        pancreatic   Hypertension Other    Hyperlipidemia Other    Social History   Socioeconomic History   Marital status: Married    Spouse name: Inocente   Number of children: Not on file   Years of education: Not on file   Highest education level: Not on file  Occupational History   Not on file  Tobacco Use   Smoking status: Former    Types: Cigarettes   Smokeless tobacco: Never  Vaping Use   Vaping status: Never Used  Substance and Sexual Activity   Alcohol use: Never   Drug use: Never   Sexual activity: Not Currently  Other Topics Concern   Not on file  Social History Narrative   Not on file   Social Drivers of Health   Financial Resource Strain: Low Risk  (04/26/2022)   Overall Financial Resource Strain (CARDIA)    Difficulty of Paying Living Expenses: Not hard at all  Food Insecurity: No Food Insecurity (06/16/2024)   Hunger Vital Sign    Worried About Running Out of Food in the Last Year: Never true    Ran Out of Food in the Last Year: Never true  Transportation Needs: No Transportation Needs (06/16/2024)   PRAPARE - Administrator, Civil Service (Medical): No    Lack of Transportation (Non-Medical): No  Physical Activity: Inactive (05/22/2022)   Exercise Vital Sign    Days of Exercise per Week: 0 days    Minutes of Exercise per Session: 0 min  Stress: No Stress Concern Present (05/22/2022)   [employer] of Occupational Health - Occupational Stress Questionnaire    Feeling of Stress : Not at all  Social Connections: Moderately Integrated (06/26/2023)   Social Connection and Isolation Panel    Frequency of Communication with Friends and Family: More than three times a week    Frequency of Social Gatherings with Friends and Family: Three times a week     Attends Religious Services: More than 4 times per year    Active Member of Clubs or Organizations: No    Attends Banker Meetings: Never    Marital Status: Married    Objective:  BP  134/62   Pulse 67   Temp 98.2 F (36.8 C)   Ht 5' 6 (1.676 m)   Wt 179 lb (81.2 kg)   SpO2 97%   BMI 28.89 kg/m      06/16/2024    1:14 PM 02/18/2024    1:18 PM 02/17/2024    2:06 PM  BP/Weight  Systolic BP 134 120 138  Diastolic BP 62 58 68  Wt. (Lbs) 179 182   BMI 28.89 kg/m2 29.38 kg/m2     Physical Exam Vitals reviewed.  Constitutional:      Appearance: Normal appearance.  Neck:     Vascular: No carotid bruit.  Cardiovascular:     Rate and Rhythm: Normal rate and regular rhythm.     Pulses:          Dorsalis pedis pulses are 1+ on the right side and 1+ on the left side.     Heart sounds: Normal heart sounds.  Pulmonary:     Effort: Pulmonary effort is normal.     Breath sounds: Normal breath sounds. No wheezing, rhonchi or rales.  Abdominal:     General: Bowel sounds are normal.     Palpations: Abdomen is soft.     Tenderness: There is no abdominal tenderness.  Musculoskeletal:        General: Deformity (right knee - valgus. left knee no deformity. tender BL.) present.  Neurological:     Mental Status: He is alert and oriented to person, place, and time.  Psychiatric:        Mood and Affect: Mood normal.        Behavior: Behavior normal.         Lab Results  Component Value Date   WBC 6.6 06/12/2024   HGB 13.4 06/12/2024   HCT 41.7 06/12/2024   PLT 189 06/12/2024   GLUCOSE 89 06/12/2024   CHOL 122 06/12/2024   TRIG 117 06/12/2024   HDL 30 (L) 06/12/2024   LDLCALC 71 06/12/2024   ALT 28 06/12/2024   AST 30 06/12/2024   NA 141 06/12/2024   K 4.8 06/12/2024   CL 104 06/12/2024   CREATININE 1.08 06/12/2024   BUN 19 06/12/2024   CO2 23 06/12/2024   TSH 1.950 10/17/2021   INR 0.96 08/22/2010   HGBA1C 5.4 06/12/2024    Results for orders placed or  performed in visit on 06/12/24  Lipid panel   Collection Time: 06/12/24  7:38 AM  Result Value Ref Range   Cholesterol, Total 122 100 - 199 mg/dL   Triglycerides 882 0 - 149 mg/dL   HDL 30 (L) >60 mg/dL   VLDL Cholesterol Cal 21 5 - 40 mg/dL   LDL Chol Calc (NIH) 71 0 - 99 mg/dL   Chol/HDL Ratio 4.1 0.0 - 5.0 ratio  Hemoglobin A1c   Collection Time: 06/12/24  7:38 AM  Result Value Ref Range   Hgb A1c MFr Bld 5.4 4.8 - 5.6 %   Est. average glucose Bld gHb Est-mCnc 108 mg/dL  Comprehensive metabolic panel with GFR   Collection Time: 06/12/24  7:38 AM  Result Value Ref Range   Glucose 89 70 - 99 mg/dL   BUN 19 8 - 27 mg/dL   Creatinine, Ser 8.91 0.76 - 1.27 mg/dL   eGFR 70 >40 fO/fpw/8.26   BUN/Creatinine Ratio 18 10 - 24   Sodium 141 134 - 144 mmol/L   Potassium 4.8 3.5 - 5.2 mmol/L   Chloride 104 96 - 106 mmol/L  CO2 23 20 - 29 mmol/L   Calcium  9.8 8.6 - 10.2 mg/dL   Total Protein 6.9 6.0 - 8.5 g/dL   Albumin 4.4 3.8 - 4.8 g/dL   Globulin, Total 2.5 1.5 - 4.5 g/dL   Bilirubin Total 0.3 0.0 - 1.2 mg/dL   Alkaline Phosphatase 53 47 - 123 IU/L   AST 30 0 - 40 IU/L   ALT 28 0 - 44 IU/L  CBC with Differential/Platelet   Collection Time: 06/12/24  7:38 AM  Result Value Ref Range   WBC 6.6 3.4 - 10.8 x10E3/uL   RBC 4.60 4.14 - 5.80 x10E6/uL   Hemoglobin 13.4 13.0 - 17.7 g/dL   Hematocrit 58.2 62.4 - 51.0 %   MCV 91 79 - 97 fL   MCH 29.1 26.6 - 33.0 pg   MCHC 32.1 31.5 - 35.7 g/dL   RDW 87.2 88.3 - 84.5 %   Platelets 189 150 - 450 x10E3/uL   Neutrophils 61 Not Estab. %   Lymphs 16 Not Estab. %   Monocytes 10 Not Estab. %   Eos 12 Not Estab. %   Basos 1 Not Estab. %   Neutrophils Absolute 4.0 1.4 - 7.0 x10E3/uL   Lymphocytes Absolute 1.1 0.7 - 3.1 x10E3/uL   Monocytes Absolute 0.6 0.1 - 0.9 x10E3/uL   EOS (ABSOLUTE) 0.8 (H) 0.0 - 0.4 x10E3/uL   Basophils Absolute 0.1 0.0 - 0.2 x10E3/uL   Immature Granulocytes 0 Not Estab. %   Immature Grans (Abs) 0.0 0.0 - 0.1  x10E3/uL  .  Assessment & Plan:   Assessment & Plan Mixed hyperlipidemia Cholesterol levels well-managed. HDL remains low. - Continue fenofibrate  160 mg once a day. - Continue Crestor  20 mg once a day. - Continue Coenzyme Q10 once a day.    Hypertensive heart disease without heart failure Blood pressure well-controlled with current medications. - Continue lisinopril  40 mg twice a day. - Continue carvedilol  6.25 mg twice a day. - Continue amlodipine  10 mg once a day.    Intermittent claudication Intermittent leg pain relieved by rest suggests peripheral arterial disease. Untreated condition increases stroke risk. - Order vascular study at [facility] to assess blood flow in legs. Orders:   VAS US  ABI WITH/WO TBI; Future  Coronary artery disease due to lipid rich plaque No angina or chest pain. Nitroglycerin  prescription outdated. Discussed nitroglycerin  efficacy duration. - Renew nitroglycerin  prescription.    Lumbar back pain Chronic low back pain managed with ibuprofen.  - Continue ibuprofen as needed for pain.    Primary osteoarthritis of both knees Chronic knee pain with visible deformity. Declined knee injection. Pain relieved by rest. - Continue ibuprofen as needed for pain.' - offered orthopedics referral, but patient wishes to defer at this time.     Encounter for immunization  Orders:   Flu vaccine HIGH DOSE PF(Fluzone  Trivalent)  Encounter for immunization  Orders:   Pfizer Comirnaty Covid-19 Vaccine 75yrs & older  Body mass index is 28.89 kg/m.    Meds ordered this encounter  Medications   nitroGLYCERIN  (NITROSTAT ) 0.4 MG SL tablet    Sig: Place 1 tablet (0.4 mg total) under the tongue every 5 (five) minutes as needed for chest pain.    Dispense:  25 tablet    Refill:  2    Orders Placed This Encounter  Procedures   Flu vaccine HIGH DOSE PF(Fluzone  Trivalent)   Pfizer Comirnaty Covid-19 Vaccine 70yrs & older   VAS US  ABI WITH/WO  TBI  LILLETTE Kato I Leal-Borjas,acting as a scribe for Abigail Free, MD.,have documented all relevant documentation on the behalf of Abigail Free, MD,as directed by  Abigail Free, MD while in the presence of Abigail Free, MD.   Follow-up: Return in about 3 months (around 09/16/2024) for chronic follow up, awv with Candice or Kim after 06/26/2024 in person.SABRA  An After Visit Summary was printed and given to the patient.  I attest that I have reviewed this visit and agree with the plan scribed by my staff.   Abigail Free, MD [facility] [phone]    "

## 2024-06-16 ENCOUNTER — Ambulatory Visit (INDEPENDENT_AMBULATORY_CARE_PROVIDER_SITE_OTHER): Admitting: Family Medicine

## 2024-06-16 ENCOUNTER — Encounter: Payer: Self-pay | Admitting: Family Medicine

## 2024-06-16 VITALS — BP 134/62 | HR 67 | Temp 98.2°F | Ht 66.0 in | Wt 179.0 lb

## 2024-06-16 DIAGNOSIS — M170 Bilateral primary osteoarthritis of knee: Secondary | ICD-10-CM

## 2024-06-16 DIAGNOSIS — I739 Peripheral vascular disease, unspecified: Secondary | ICD-10-CM | POA: Diagnosis not present

## 2024-06-16 DIAGNOSIS — M545 Low back pain, unspecified: Secondary | ICD-10-CM

## 2024-06-16 DIAGNOSIS — I2510 Atherosclerotic heart disease of native coronary artery without angina pectoris: Secondary | ICD-10-CM

## 2024-06-16 DIAGNOSIS — I119 Hypertensive heart disease without heart failure: Secondary | ICD-10-CM | POA: Diagnosis not present

## 2024-06-16 DIAGNOSIS — E782 Mixed hyperlipidemia: Secondary | ICD-10-CM | POA: Diagnosis not present

## 2024-06-16 DIAGNOSIS — Z23 Encounter for immunization: Secondary | ICD-10-CM

## 2024-06-16 DIAGNOSIS — I2583 Coronary atherosclerosis due to lipid rich plaque: Secondary | ICD-10-CM

## 2024-06-16 MED ORDER — NITROGLYCERIN 0.4 MG SL SUBL: 0.4000 mg | SUBLINGUAL_TABLET | SUBLINGUAL | 2 refills | Status: AC | PRN

## 2024-06-16 NOTE — Patient Instructions (Addendum)
  VISIT SUMMARY: Today, we discussed your leg pain and difficulty with exertion, as well as your ongoing management for heart disease, cholesterol, knee arthritis, and chronic low back pain. We have planned further tests to assess your leg pain and updated your prescriptions as needed.  YOUR PLAN: HYPERTENSIVE HEART DISEASE WITHOUT HEART FAILURE: Your blood pressure is well-controlled with your current medications. -Continue taking lisinopril  40 mg twice a day. -Continue taking carvedilol  6.25 mg twice a day. -Continue taking amlodipine  10 mg once a day.  ATHEROSCLEROTIC HEART DISEASE OF NATIVE CORONARY ARTERY WITHOUT ANGINA PECTORIS: You do not have chest pain, but your nitroglycerin  prescription was outdated. -Renew your nitroglycerin  prescription.  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 -A vascular study will be ordered at [facility] to check the blood flow in your legs.  If I have ordered a referral, lab work, or a test, please watch for messages/letters in your [place]. Please be aware of unknown numbers, as this may be a specialist's office attempting to call and schedule your appointment. You may wish to enter the specialist's phone number in your contacts, so your phone will not block the calls as SPAM. If you have NOT been contacted with in 2 weeks: Please call the specialist's office  Call [facility].   MIXED HYPERLIPIDEMIA: Your cholesterol levels are well-managed, but your HDL remains low. -Continue taking fenofibrate  160 mg once a day. -Continue taking Crestor  20 mg once a day. -Continue taking Coenzyme Q10 once a day.  BILATERAL PRIMARY OSTEOARTHRITIS OF KNEE: You have chronic knee pain that is relieved by rest. -Continue taking ibuprofen as needed for pain.  CHRONIC LOW BACK PAIN WITH HISTORY OF LUMBAR SURGERY: Your chronic  low back pain is managed with ibuprofen. -Continue taking ibuprofen as needed for pain.

## 2024-06-16 NOTE — Assessment & Plan Note (Addendum)
  Orders:   Pfizer Comirnaty Covid-19 Vaccine 81yrs & older

## 2024-06-18 ENCOUNTER — Telehealth: Payer: Self-pay

## 2024-06-18 NOTE — Assessment & Plan Note (Signed)
 Chronic low back pain managed with ibuprofen.  - Continue ibuprofen as needed for pain.

## 2024-06-18 NOTE — Telephone Encounter (Signed)
 Copied from CRM [id number]. Topic: Clinical - Request for Lab/Test Order >> Jun 18, 2024 11:47 AM Mia F wrote: Reason for CRM: Heather from [facility]  ordered an ABI for pt and they still do not have an authorization for pt and appt cannot be scheduled without an auth. [phone]  Also available on Teams [facility]

## 2024-06-30 ENCOUNTER — Ambulatory Visit

## 2024-07-03 ENCOUNTER — Other Ambulatory Visit: Payer: Self-pay | Admitting: Family Medicine

## 2024-07-13 ENCOUNTER — Other Ambulatory Visit: Payer: Self-pay | Admitting: Family Medicine

## 2024-07-22 ENCOUNTER — Ambulatory Visit: Attending: Family Medicine

## 2024-07-22 ENCOUNTER — Ambulatory Visit: Payer: Self-pay | Admitting: Family Medicine

## 2024-07-22 DIAGNOSIS — I739 Peripheral vascular disease, unspecified: Secondary | ICD-10-CM | POA: Diagnosis not present

## 2024-07-27 LAB — VAS US ABI WITH/WO TBI
Left ABI: 1.16
Right ABI: 1.02

## 2024-07-29 ENCOUNTER — Ambulatory Visit

## 2024-08-05 ENCOUNTER — Ambulatory Visit

## 2024-08-05 VITALS — BP 130/84 | HR 63 | Temp 97.6°F | Ht 66.0 in | Wt 182.6 lb

## 2024-08-05 DIAGNOSIS — Z0000 Encounter for general adult medical examination without abnormal findings: Secondary | ICD-10-CM

## 2024-08-05 NOTE — Patient Instructions (Signed)
 Bryan Zimmerman , Thank you for taking time to come for your Medicare Wellness Visit. I appreciate your ongoing commitment to your health goals. Please review the following plan we discussed and let me know if I can assist you in the future.   These are the goals we discussed:  Goals       Learn More About My Health      Timeframe:  Long-Range Goal Priority:  High Start Date:                             Expected End Date:                        Follow Up Date 12/2021    - tell my story and reason for my visit - make a list of questions - ask questions - repeat what I heard to make sure I understand - bring a list of my medicines to the visit - speak up when I don't understand    Why is this important?   The best way to learn about your health and care is by talking to the doctor and nurse.  They will answer your questions and give you information in the way that you like best.    Notes:       Manage My Medicine      Timeframe:  Long-Range Goal Priority:  High Start Date:                             Expected End Date:                       Follow Up Date 12/2021    - call for medicine refill 2 or 3 days before it runs out - keep a list of all the medicines I take; vitamins and herbals too - use a pillbox to sort medicine    Why is this important?   These steps will help you keep on track with your medicines.   Notes:       Patient Stated      Continue current lifestyle      Patient Stated (pt-stated)      Patient states he would like to keep up with the yard work.       pharmacy care plan      CARE PLAN ENTRY (see longitudinal plan of care for additional care plan information)  Current Barriers:  Chronic Disease Management support, education, and care coordination needs related to Hypertension and Hyperlipidemia   Hypertension BP Readings from Last 3 Encounters:  06/06/20 122/78  04/21/20 136/78  03/21/20 (!) 154/64   Pharmacist Clinical Goal(s): Over the  next 90 days, patient will work with PharmD and providers to maintain BP goal <130/80 Current regimen:  Carvedilol  6.25 mg bid  Lisinopril  40 mg bid  Interventions: Encouraged healthy diet of fruits, vegetables and lean protein. Discussed continuing to staying active in yard and with home projects.  Reviewed home blood pressure readings and medication regimen.  Patient self care activities - Over the next 90 days, patient will: Continue taking medications as prescribed.  Ensure daily salt intake < 2300 mg/day  Hyperlipidemia Lab Results  Component Value Date/Time   LDLCALC 91 06/03/2020 11:07 AM   Pharmacist Clinical Goal(s): Over the next 90 days, patient will work with PharmD and  providers to achieve LDL goal < 70 Current regimen:  Aspirin ec 81 mg daily  Fenofibrate  160 mg daily  Pravastatin  40 mg daily evening meal  Nitroglycerin  0.4 mg prn chest pain  Co-enzyme q-10 30 mg tid  Interventions: Discussed importance of low-fat diet and exercise.  Coordinated refill of fenofibrate  sent to [facility].  Reviewed lipid panel and discussed previous intolerance to atorvastatin.  Patient self care activities - Over the next 90 days, patient will: Continue current medication.    Medication management Pharmacist Clinical Goal(s): Over the next 90 days, patient will work with PharmD and providers to maintain optimal medication adherence Current pharmacy: [facility]  Interventions Comprehensive medication review performed. Continue current medication management strategy Patient self care activities - Over the next 90 days, patient will: Focus on medication adherence by using pill box  Take medications as prescribed Report any questions or concerns to PharmD and/or provider(s)  Initial goal documentation       Track and Manage My Blood Pressure-Hypertension      Timeframe:  Long-Range Goal Priority:  High Start Date:                             Expected End Date:                        Follow Up Date 12/2021    - check blood pressure weekly - write blood pressure results in a log or diary    Why is this important?   You won't feel high blood pressure, but it can still hurt your blood vessels.  High blood pressure can cause heart or kidney problems. It can also cause a stroke.  Making lifestyle changes like losing a little weight or eating less salt will help.  Checking your blood pressure at home and at different times of the day can help to control blood pressure.  If the doctor prescribes medicine remember to take it the way the doctor ordered.  Call the office if you cannot afford the medicine or if there are questions about it.     Notes:         This is a list of the screening recommended for you and due dates:  Health Maintenance  Topic Date Due   COVID-19 Vaccine (8 - 2025-26 season) 12/15/2024   Medicare Annual Wellness Visit  08/05/2025   DTaP/Tdap/Td vaccine (3 - Td or Tdap) 04/21/2030   Pneumococcal Vaccine for age over 72  Completed   Flu Shot  Completed   Hepatitis C Screening  Completed   Zoster (Shingles) Vaccine  Completed   Meningitis B Vaccine  Aged Out   Colon Cancer Screening  Discontinued    Advanced directives: yes   Next appointment: Follow up in one year for your annual wellness visit. 12.30.2026 at 9:00 am.   Preventive Care 65 Years and Older, Male  Preventive care refers to lifestyle choices and visits with your health care provider that can promote health and wellness. What does preventive care include? A yearly physical exam. This is also called an annual well check. Dental exams once or twice a year. Routine eye exams. Ask your health care provider how often you should have your eyes checked. Personal lifestyle choices, including: Daily care of your teeth and gums. Regular physical activity. Eating a healthy diet. Avoiding tobacco and drug use. Limiting alcohol use. Practicing safe sex. Taking low doses of  aspirin every day. Taking vitamin and mineral supplements as recommended by your health care provider. What happens during an annual well check? The services and screenings done by your health care provider during your annual well check will depend on your age, overall health, lifestyle risk factors, and family history of disease. Counseling  Your health care provider may ask you questions about your: Alcohol use. Tobacco use. Drug use. Emotional well-being. Home and relationship well-being. Sexual activity. Eating habits. History of falls. Memory and ability to understand (cognition). Work and work astronomer. Screening  You may have the following tests or measurements: Height, weight, and BMI. Blood pressure. Lipid and cholesterol levels. These may be checked every 5 years, or more frequently if you are over 72 years old. Skin check. Lung cancer screening. You may have this screening every year starting at age 74 if you have a 30-pack-year history of smoking and currently smoke or have quit within the past 15 years. Fecal occult blood test (FOBT) of the stool. You may have this test every year starting at age 57. Flexible sigmoidoscopy or colonoscopy. You may have a sigmoidoscopy every 5 years or a colonoscopy every 10 years starting at age 52. Prostate cancer screening. Recommendations will vary depending on your family history and other risks. Hepatitis C blood test. Hepatitis B blood test. Sexually transmitted disease (STD) testing. Diabetes screening. This is done by checking your blood sugar (glucose) after you have not eaten for a while (fasting). You may have this done every 1-3 years. Abdominal aortic aneurysm (AAA) screening. You may need this if you are a current or former smoker. Osteoporosis. You may be screened starting at age 43 if you are at high risk. Talk with your health care provider about your test results, treatment options, and if necessary, the need for more  tests. Vaccines  Your health care provider may recommend certain vaccines, such as: Influenza vaccine. This is recommended every year. Tetanus, diphtheria, and acellular pertussis (Tdap, Td) vaccine. You may need a Td booster every 10 years. Zoster vaccine. You may need this after age 45. Pneumococcal 13-valent conjugate (PCV13) vaccine. One dose is recommended after age 83. Pneumococcal polysaccharide (PPSV23) vaccine. One dose is recommended after age 72. Talk to your health care provider about which screenings and vaccines you need and how often you need them. This information is not intended to replace advice given to you by your health care provider. Make sure you discuss any questions you have with your health care provider. Document Released: 09/02/2015 Document Revised: 04/25/2016 Document Reviewed: 06/07/2015 Elsevier Interactive Patient Education  2017 [employer].  Fall Prevention in the Home Falls can cause injuries. They can happen to people of all ages. There are many things you can do to make your home safe and to help prevent falls. What can I do on the outside of my home? Regularly fix the edges of walkways and driveways and fix any cracks. Remove anything that might make you trip as you walk through a door, such as a raised step or threshold. Trim any bushes or trees on the path to your home. Use bright outdoor lighting. Clear any walking paths of anything that might make someone trip, such as rocks or tools. Regularly check to see if handrails are loose or broken. Make sure that both sides of any steps have handrails. Any raised decks and porches should have guardrails on the edges. Have any leaves, snow, or ice cleared regularly. Use sand or salt on walking  paths during winter. Clean up any spills in your garage right away. This includes oil or grease spills. What can I do in the bathroom? Use night lights. Install grab bars by the toilet and in the tub and shower.  Do not use towel bars as grab bars. Use non-skid mats or decals in the tub or shower. If you need to sit down in the shower, use a plastic, non-slip stool. Keep the floor dry. Clean up any water that spills on the floor as soon as it happens. Remove soap buildup in the tub or shower regularly. Attach bath mats securely with double-sided non-slip rug tape. Do not have throw rugs and other things on the floor that can make you trip. What can I do in the bedroom? Use night lights. Make sure that you have a light by your bed that is easy to reach. Do not use any sheets or blankets that are too big for your bed. They should not hang down onto the floor. Have a firm chair that has side arms. You can use this for support while you get dressed. Do not have throw rugs and other things on the floor that can make you trip. What can I do in the kitchen? Clean up any spills right away. Avoid walking on wet floors. Keep items that you use a lot in easy-to-reach places. If you need to reach something above you, use a strong step stool that has a grab bar. Keep electrical cords out of the way. Do not use floor polish or wax that makes floors slippery. If you must use wax, use non-skid floor wax. Do not have throw rugs and other things on the floor that can make you trip. What can I do with my stairs? Do not leave any items on the stairs. Make sure that there are handrails on both sides of the stairs and use them. Fix handrails that are broken or loose. Make sure that handrails are as long as the stairways. Check any carpeting to make sure that it is firmly attached to the stairs. Fix any carpet that is loose or worn. Avoid having throw rugs at the top or bottom of the stairs. If you do have throw rugs, attach them to the floor with carpet tape. Make sure that you have a light switch at the top of the stairs and the bottom of the stairs. If you do not have them, ask someone to add them for you. What else  can I do to help prevent falls? Wear shoes that: Do not have high heels. Have rubber bottoms. Are comfortable and fit you well. Are closed at the toe. Do not wear sandals. If you use a stepladder: Make sure that it is fully opened. Do not climb a closed stepladder. Make sure that both sides of the stepladder are locked into place. Ask someone to hold it for you, if possible. Clearly mark and make sure that you can see: Any grab bars or handrails. First and last steps. Where the edge of each step is. Use tools that help you move around (mobility aids) if they are needed. These include: Canes. Walkers. Scooters. Crutches. Turn on the lights when you go into a dark area. Replace any light bulbs as soon as they burn out. Set up your furniture so you have a clear path. Avoid moving your furniture around. If any of your floors are uneven, fix them. If there are any pets around you, be aware of where they are.  Review your medicines with your doctor. Some medicines can make you feel dizzy. This can increase your chance of falling. Ask your doctor what other things that you can do to help prevent falls. This information is not intended to replace advice given to you by your health care provider. Make sure you discuss any questions you have with your health care provider. Document Released: 06/02/2009 Document Revised: 01/12/2016 Document Reviewed: 09/10/2014 Elsevier Interactive Patient Education  2017 [employer].

## 2024-08-05 NOTE — Progress Notes (Signed)
 Chief Complaint  Patient presents with   Annual Exam     Subjective:   Bryan Zimmerman is a 78 y.o. male who presents for a Medicare Annual Wellness Visit.  Visit info / Clinical Intake: Medicare Wellness Visit Type:: Subsequent Annual Wellness Visit Persons participating in visit and providing information:: patient Medicare Wellness Visit Mode:: In-person (required for WTM) Interpreter Needed?: No Pre-visit prep was completed: no AWV questionnaire completed by patient prior to visit?: no Living arrangements:: lives with spouse/significant other Patient's Overall Health Status Rating: very good Typical amount of pain: none Does pain affect daily life?: no Are you currently prescribed opioids?: no  Dietary Habits and Nutritional Risks How many meals a day?: 3 Eats fruit and vegetables daily?: yes Most meals are obtained by: preparing own meals; eating out In the last 2 weeks, have you had any of the following?: none Diabetic:: no  Functional Status Activities of Daily Living (to include ambulation/medication): Independent Ambulation: Independent Medication Administration: Independent Home Management (perform basic housework or laundry): Independent Manage your own finances?: yes Primary transportation is: driving Concerns about vision?: no *vision screening is required for WTM* Concerns about hearing?: no  Fall Screening Falls in the past year?: 0 Number of falls in past year: 0 Was there an injury with Fall?: 0 Fall Risk Category Calculator: 0 Patient Fall Risk Level: Low Fall Risk  Fall Risk Patient at Risk for Falls Due to: No Fall Risks Fall risk Follow up: Falls evaluation completed  Home and Transportation Safety: All rugs have non-skid backing?: N/A, no rugs All stairs or steps have railings?: yes Grab bars in the bathtub or shower?: yes Have non-skid surface in bathtub or shower?: yes Good home lighting?: yes Regular seat belt use?: yes Hospital  stays in the last year:: no  Cognitive Assessment Difficulty concentrating, remembering, or making decisions? : no Will 6CIT or Mini Cog be Completed: yes What year is it?: 0 points What month is it?: 0 points Give patient an address phrase to remember (5 components): [address] [place] About what time is it?: 0 points Count backwards from 20 to 1: 0 points Say the months of the year in reverse: 0 points Repeat the address phrase from earlier: 2 points 6 CIT Score: 2 points  Advance Directives (For Healthcare) Does Patient Have a Medical Advance Directive?: Yes Type of Advance Directive: Healthcare Power of [place]; Living will  Reviewed/Updated  Reviewed/Updated: Reviewed All (Medical, Surgical, Family, Medications, Allergies, Care Teams, Patient Goals)    Allergies (verified) Patient has no known allergies.   Current Medications (verified) Outpatient Encounter Medications as of 08/05/2024  Medication Sig   amLODipine  (NORVASC ) 10 MG tablet Take 1 tablet (10 mg total) by mouth daily.   aspirin EC 81 MG tablet Take 81 mg by mouth daily. Swallow whole.   carvedilol  (COREG ) 6.25 MG tablet Take 1 tablet (6.25 mg total) by mouth 2 (two) times daily with a meal.   cetirizine (ZYRTEC) 10 MG tablet Take 10 mg by mouth daily.   co-enzyme Q-10 30 MG capsule Take 100 mg by mouth daily.   fenofibrate  160 MG tablet TAKE ONE TABLET BY MOUTH ONCE DAILY   lisinopril  (ZESTRIL ) 40 MG tablet Take 1 tablet (40 mg total) by mouth 2 (two) times daily.   Misc Natural Products (PROSTATE SUPPORT) 300-15 MG TABS Take 1 tablet by mouth daily.   nitroGLYCERIN  (NITROSTAT ) 0.4 MG SL tablet Place 1 tablet (0.4 mg total) under the tongue every 5 (five)  minutes as needed for chest pain.   rosuvastatin  (CRESTOR ) 20 MG tablet TAKE 1 TABLET BY MOUTH ONCE DAILY   No facility-administered encounter medications on file as of 08/05/2024.    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     Family History  Problem Relation Age of Onset   Dementia Mother    Cancer Father        pancreatic   Hypertension Other    Hyperlipidemia Other    Social History   Occupational History   Not on file  Tobacco Use   Smoking status: Former    Types: Cigarettes   Smokeless tobacco: Never  Vaping Use   Vaping status: Never Used  Substance and Sexual Activity   Alcohol use: Never   Drug use: Never   Sexual activity: Not Currently   Tobacco Counseling Counseling given: Not Answered  SDOH Screenings   Food Insecurity: No Food Insecurity (08/05/2024)  Housing: Unknown (08/05/2024)  Transportation Needs: No Transportation Needs (08/05/2024)  Utilities: Not At Risk (08/05/2024)  Alcohol Screen: Low Risk (06/26/2023)  Depression (PHQ2-9): Low Risk (08/05/2024)  Financial Resource Strain: Low Risk (04/26/2022)  Physical Activity: Inactive (08/05/2024)  Social Connections: Moderately Integrated (08/05/2024)  Stress: No Stress Concern Present (08/05/2024)  Tobacco Use: Medium Risk (08/05/2024)  Health Literacy: Adequate Health Literacy (08/05/2024)   See flowsheets for full screening details  Depression Screen PHQ 2 & 9 Depression Scale- Over the past 2 weeks, how often have you been bothered by any of the following problems? Little interest or pleasure in doing things: 0 Feeling down, depressed, or hopeless (PHQ Adolescent also includes...irritable): 0 PHQ-2 Total Score: 0 Trouble falling or staying asleep, or sleeping too much: 0 Feeling tired or having little energy: 0 Poor appetite or overeating (PHQ Adolescent also includes...weight  loss): 0 Feeling bad about yourself - or that you are a failure or have let yourself or your family down: 0 Trouble concentrating on things, such as reading the newspaper or watching television (PHQ Adolescent also includes...like school work): 0 Moving or speaking so slowly that other people could have noticed. Or the opposite - being so fidgety or restless that you have been moving around a lot more than usual: 0 Thoughts that you would be better off dead, or of hurting yourself in some way: 0 PHQ-9 Total Score: 0 If you checked off any problems, how difficult have these problems made it for you to do your work, take care of things at home, or get along with other people?: Not difficult at all  Depression Treatment Depression Interventions/Treatment : [id number] Score <4 Follow-up Not Indicated     Goals Addressed               This Visit's Progress     Patient Stated (pt-stated)        Patient states he would like to keep up with the yard work.              Objective:    Today's Vitals   08/05/24 1055  BP: 130/84  Pulse: 63  Temp: 97.6 F (36.4 C)  SpO2: 96%  Weight: 182 lb 9.6 oz (82.8 kg)  Height: 5' 6 (1.676 m)   Body mass index is 29.47 kg/m.  Hearing/Vision screen No results found. Immunizations and Health Maintenance Health Maintenance  Topic Date Due   COVID-19 Vaccine (8 - 2025-26 season) 12/15/2024   Medicare Annual Wellness (AWV)  08/05/2025   DTaP/Tdap/Td (3 - Td or Tdap) 04/21/2030   Pneumococcal Vaccine: 50+ Years  Completed   Influenza Vaccine  Completed   Hepatitis C Screening  Completed   Zoster Vaccines- Shingrix  Completed   Meningococcal B Vaccine  Aged Out   Colonoscopy  Discontinued        Assessment/Plan:  This is a routine wellness examination for Bryan Zimmerman.  Patient Care Team: Sherre Clapper, MD as PCP - General (Family Medicine) Bernie Lamar PARAS, MD as Consulting Physician (Cardiology)  I have personally reviewed and noted  the following in the patients chart:   Medical and social history Use of alcohol, tobacco or illicit drugs  Current medications and supplements including opioid prescriptions. Functional ability and status Nutritional status Physical activity Advanced directives List of other physicians Hospitalizations, surgeries, and ER visits in previous 12 months Vitals Screenings to include cognitive, depression, and falls Referrals and appointments  No orders of the defined types were placed in this encounter.  In addition, I have reviewed and discussed with patient certain preventive protocols, quality metrics, and best practice recommendations. A written personalized care plan for preventive services as well as general preventive health recommendations were provided to patient.   [place] [employer], [place]   08/05/2024   Return in 1 year (on 08/05/2025).  After Visit Summary: (In Person-Declined) Patient declined AVS at this time.  Nurse Notes: I spent 30 minutes with patient and his wife. Patient has no questions or concerns at this time.

## 2024-08-13 ENCOUNTER — Other Ambulatory Visit: Payer: Self-pay | Admitting: Family Medicine

## 2024-09-22 ENCOUNTER — Ambulatory Visit

## 2024-09-23 ENCOUNTER — Other Ambulatory Visit: Payer: Self-pay | Admitting: Family Medicine

## 2024-11-30 ENCOUNTER — Ambulatory Visit: Admitting: Family Medicine

## 2025-08-18 ENCOUNTER — Ambulatory Visit
